# Patient Record
Sex: Male | Born: 1950 | Race: White | Hispanic: No | Marital: Married | Smoking: Never smoker
Health system: Southern US, Community
[De-identification: ages and names within clinical notes are randomized; demographics above are authoritative.]

## PROBLEM LIST (undated history)

## (undated) DIAGNOSIS — E119 Type 2 diabetes mellitus without complications: Secondary | ICD-10-CM

## (undated) DIAGNOSIS — N289 Disorder of kidney and ureter, unspecified: Secondary | ICD-10-CM

## (undated) DIAGNOSIS — S37009A Unspecified injury of unspecified kidney, initial encounter: Secondary | ICD-10-CM

## (undated) HISTORY — PX: FOOT SURGERY: SHX648

## (undated) SURGERY — Surgical Case
Anesthesia: *Unknown

---

## 2019-12-08 ENCOUNTER — Emergency Department (HOSPITAL_COMMUNITY): Payer: Medicare Other

## 2019-12-08 ENCOUNTER — Other Ambulatory Visit: Payer: Self-pay

## 2019-12-08 ENCOUNTER — Encounter (HOSPITAL_COMMUNITY): Payer: Self-pay | Admitting: *Deleted

## 2019-12-08 ENCOUNTER — Inpatient Hospital Stay (HOSPITAL_COMMUNITY)
Admission: EM | Admit: 2019-12-08 | Discharge: 2019-12-13 | DRG: 629 | Disposition: A | Discharge status: Home Health | Payer: Medicare Other | Attending: Family Medicine | Admitting: Family Medicine

## 2019-12-08 DIAGNOSIS — Z79899 Other long term (current) drug therapy: Secondary | ICD-10-CM | POA: Diagnosis not present

## 2019-12-08 DIAGNOSIS — E1165 Type 2 diabetes mellitus with hyperglycemia: Secondary | ICD-10-CM | POA: Diagnosis present

## 2019-12-08 DIAGNOSIS — E785 Hyperlipidemia, unspecified: Secondary | ICD-10-CM | POA: Diagnosis present

## 2019-12-08 DIAGNOSIS — L03115 Cellulitis of right lower limb: Secondary | ICD-10-CM | POA: Diagnosis present

## 2019-12-08 DIAGNOSIS — Z8249 Family history of ischemic heart disease and other diseases of the circulatory system: Secondary | ICD-10-CM | POA: Diagnosis not present

## 2019-12-08 DIAGNOSIS — M86172 Other acute osteomyelitis, left ankle and foot: Secondary | ICD-10-CM | POA: Diagnosis present

## 2019-12-08 DIAGNOSIS — I10 Essential (primary) hypertension: Secondary | ICD-10-CM | POA: Diagnosis present

## 2019-12-08 DIAGNOSIS — L97529 Non-pressure chronic ulcer of other part of left foot with unspecified severity: Secondary | ICD-10-CM | POA: Diagnosis present

## 2019-12-08 DIAGNOSIS — N184 Chronic kidney disease, stage 4 (severe): Secondary | ICD-10-CM | POA: Diagnosis present

## 2019-12-08 DIAGNOSIS — E1169 Type 2 diabetes mellitus with other specified complication: Principal | ICD-10-CM | POA: Diagnosis present

## 2019-12-08 DIAGNOSIS — M86672 Other chronic osteomyelitis, left ankle and foot: Secondary | ICD-10-CM | POA: Diagnosis present

## 2019-12-08 DIAGNOSIS — E11621 Type 2 diabetes mellitus with foot ulcer: Secondary | ICD-10-CM | POA: Diagnosis present

## 2019-12-08 DIAGNOSIS — D649 Anemia, unspecified: Secondary | ICD-10-CM | POA: Diagnosis present

## 2019-12-08 DIAGNOSIS — R600 Localized edema: Secondary | ICD-10-CM | POA: Diagnosis present

## 2019-12-08 DIAGNOSIS — M869 Osteomyelitis, unspecified: Secondary | ICD-10-CM

## 2019-12-08 DIAGNOSIS — E1122 Type 2 diabetes mellitus with diabetic chronic kidney disease: Secondary | ICD-10-CM | POA: Diagnosis present

## 2019-12-08 DIAGNOSIS — N179 Acute kidney failure, unspecified: Secondary | ICD-10-CM | POA: Diagnosis present

## 2019-12-08 DIAGNOSIS — L03116 Cellulitis of left lower limb: Secondary | ICD-10-CM | POA: Diagnosis present

## 2019-12-08 DIAGNOSIS — Z833 Family history of diabetes mellitus: Secondary | ICD-10-CM | POA: Diagnosis not present

## 2019-12-08 DIAGNOSIS — L97429 Non-pressure chronic ulcer of left heel and midfoot with unspecified severity: Secondary | ICD-10-CM | POA: Diagnosis present

## 2019-12-08 DIAGNOSIS — Z20822 Contact with and (suspected) exposure to covid-19: Secondary | ICD-10-CM | POA: Diagnosis present

## 2019-12-08 DIAGNOSIS — E1142 Type 2 diabetes mellitus with diabetic polyneuropathy: Secondary | ICD-10-CM | POA: Diagnosis present

## 2019-12-08 DIAGNOSIS — I129 Hypertensive chronic kidney disease with stage 1 through stage 4 chronic kidney disease, or unspecified chronic kidney disease: Secondary | ICD-10-CM | POA: Diagnosis present

## 2019-12-08 DIAGNOSIS — Z9884 Bariatric surgery status: Secondary | ICD-10-CM

## 2019-12-08 DIAGNOSIS — F329 Major depressive disorder, single episode, unspecified: Secondary | ICD-10-CM | POA: Diagnosis present

## 2019-12-08 DIAGNOSIS — Z6841 Body Mass Index (BMI) 40.0 and over, adult: Secondary | ICD-10-CM

## 2019-12-08 DIAGNOSIS — IMO0002 Reserved for concepts with insufficient information to code with codable children: Secondary | ICD-10-CM | POA: Diagnosis present

## 2019-12-08 HISTORY — DX: Type 2 diabetes mellitus without complications: E11.9

## 2019-12-08 HISTORY — DX: Unspecified injury of unspecified kidney, initial encounter: S37.009A

## 2019-12-08 HISTORY — DX: Disorder of kidney and ureter, unspecified: N28.9

## 2019-12-08 LAB — COMPREHENSIVE METABOLIC PANEL
ALT: 14 U/L (ref 0–44)
AST: 19 U/L (ref 15–41)
Albumin: 2.9 g/dL — ABNORMAL LOW (ref 3.5–5.0)
Alkaline Phosphatase: 102 U/L (ref 38–126)
Anion gap: 15 (ref 5–15)
BUN: 56 mg/dL — ABNORMAL HIGH (ref 8–23)
CO2: 20 mmol/L — ABNORMAL LOW (ref 22–32)
Calcium: 8.3 mg/dL — ABNORMAL LOW (ref 8.9–10.3)
Chloride: 103 mmol/L (ref 98–111)
Creatinine, Ser: 3.55 mg/dL — ABNORMAL HIGH (ref 0.61–1.24)
GFR calc Af Amer: 19 mL/min — ABNORMAL LOW (ref >60)
GFR calc non Af Amer: 16 mL/min — ABNORMAL LOW (ref >60)
Glucose, Bld: 196 mg/dL — ABNORMAL HIGH (ref 70–99)
Potassium: 3.8 mmol/L (ref 3.5–5.1)
Sodium: 138 mmol/L (ref 135–145)
Total Bilirubin: 0.6 mg/dL (ref 0.3–1.2)
Total Protein: 8.1 g/dL (ref 6.5–8.1)

## 2019-12-08 LAB — CBC WITH DIFFERENTIAL/PLATELET
Abs Immature Granulocytes: 0.12 10*3/uL — ABNORMAL HIGH (ref 0.00–0.07)
Basophils Absolute: 0 10*3/uL (ref 0.0–0.1)
Basophils Relative: 0 %
Eosinophils Absolute: 0 10*3/uL (ref 0.0–0.5)
Eosinophils Relative: 0 %
HCT: 33.5 % — ABNORMAL LOW (ref 39.0–52.0)
Hemoglobin: 10 g/dL — ABNORMAL LOW (ref 13.0–17.0)
Immature Granulocytes: 1 %
Lymphocytes Relative: 2 %
Lymphs Abs: 0.4 10*3/uL — ABNORMAL LOW (ref 0.7–4.0)
MCH: 28.2 pg (ref 26.0–34.0)
MCHC: 29.9 g/dL — ABNORMAL LOW (ref 30.0–36.0)
MCV: 94.6 fL (ref 80.0–100.0)
Monocytes Absolute: 0.9 10*3/uL (ref 0.1–1.0)
Monocytes Relative: 5 %
Neutro Abs: 16.4 10*3/uL — ABNORMAL HIGH (ref 1.7–7.7)
Neutrophils Relative %: 92 %
Platelets: 467 10*3/uL — ABNORMAL HIGH (ref 150–400)
RBC: 3.54 MIL/uL — ABNORMAL LOW (ref 4.22–5.81)
RDW: 15.9 % — ABNORMAL HIGH (ref 11.5–15.5)
WBC: 17.9 10*3/uL — ABNORMAL HIGH (ref 4.0–10.5)
nRBC: 0 % (ref 0.0–0.2)

## 2019-12-08 LAB — GLUCOSE, CAPILLARY: Glucose-Capillary: 161 mg/dL — ABNORMAL HIGH (ref 70–99)

## 2019-12-08 LAB — SARS CORONAVIRUS 2 BY RT PCR (HOSPITAL ORDER, PERFORMED IN ~~LOC~~ HOSPITAL LAB): SARS Coronavirus 2: NEGATIVE

## 2019-12-08 MED ORDER — HYDROCODONE-ACETAMINOPHEN 5-325 MG PO TABS: 1.0000 | ORAL_TABLET | ORAL | Status: DC | PRN

## 2019-12-08 MED ORDER — INSULIN ASPART 100 UNIT/ML ~~LOC~~ SOLN: 0.0000 [IU] | Freq: Every day | SUBCUTANEOUS | Status: DC

## 2019-12-08 MED ORDER — ONDANSETRON HCL 4 MG/2ML IJ SOLN
4.0000 mg | Freq: Four times a day (QID) | INTRAMUSCULAR | Status: DC | PRN
  Administered 2019-12-11: 4 mg via INTRAVENOUS
  Filled 2019-12-08: qty 2

## 2019-12-08 MED ORDER — ACETAMINOPHEN 325 MG PO TABS: 650.0000 mg | ORAL_TABLET | Freq: Four times a day (QID) | ORAL | Status: DC | PRN

## 2019-12-08 MED ORDER — MORPHINE SULFATE (PF) 2 MG/ML IV SOLN
2.0000 mg | INTRAVENOUS | Status: DC | PRN
  Administered 2019-12-08 – 2019-12-12 (×4): 2 mg via INTRAVENOUS
  Filled 2019-12-08 (×4): qty 1

## 2019-12-08 MED ORDER — SODIUM CHLORIDE 0.9 % IV SOLN: INTRAVENOUS | Status: DC

## 2019-12-08 MED ORDER — VANCOMYCIN HCL IN DEXTROSE 1-5 GM/200ML-% IV SOLN
1000.0000 mg | Freq: Once | INTRAVENOUS | Status: AC
  Administered 2019-12-08: 1000 mg via INTRAVENOUS
  Filled 2019-12-08: qty 200

## 2019-12-08 MED ORDER — INSULIN ASPART 100 UNIT/ML ~~LOC~~ SOLN
0.0000 [IU] | Freq: Three times a day (TID) | SUBCUTANEOUS | Status: DC
  Administered 2019-12-09 – 2019-12-10 (×2): 2 [IU] via SUBCUTANEOUS

## 2019-12-08 MED ORDER — ONDANSETRON HCL 4 MG PO TABS: 4.0000 mg | ORAL_TABLET | Freq: Four times a day (QID) | ORAL | Status: DC | PRN

## 2019-12-08 MED ORDER — SODIUM CHLORIDE 0.9 % IV SOLN
2.0000 g | Freq: Once | INTRAVENOUS | Status: AC
  Administered 2019-12-08: 2 g via INTRAVENOUS
  Filled 2019-12-08: qty 2

## 2019-12-08 MED ORDER — ACETAMINOPHEN 650 MG RE SUPP: 650.0000 mg | Freq: Four times a day (QID) | RECTAL | Status: DC | PRN

## 2019-12-08 MED ORDER — METRONIDAZOLE IN NACL 5-0.79 MG/ML-% IV SOLN
500.0000 mg | Freq: Once | INTRAVENOUS | Status: AC
  Administered 2019-12-08: 500 mg via INTRAVENOUS
  Filled 2019-12-08: qty 100

## 2019-12-08 NOTE — ED Provider Notes (Signed)
[place] Provider Note   CSN: [id number] Arrival date & time: 12/08/19  1630     History Chief Complaint  Patient presents with  . Foot Pain    Connor Taylor is a 69 y.o. male.  The history is provided by the patient. No language interpreter was used.  Foot Pain The current episode started more than 1 week ago. The problem occurs constantly. The problem has been gradually worsening. Nothing aggravates the symptoms. Nothing relieves the symptoms.   Pt was sent here by Dr. Caprice Beaver for admission.  Pt has an infection to his left foot.  Infection is getting worse    Past Medical History:  Diagnosis Date  . Diabetes mellitus without complication ([place])   . Kidney damage     There are no problems to display for this patient.   Past Surgical History:  Procedure Laterality Date  . FOOT SURGERY         No family history on file.  Social History   Tobacco Use  . Smoking status: Never Smoker  . Smokeless tobacco: Never Used  Substance Use Topics  . Alcohol use: Never  . Drug use: Never    Home Medications Prior to Admission medications   Not on File    Allergies    Patient has no known allergies.  Review of Systems   Review of Systems  All other systems reviewed and are negative.   Physical Exam Updated Vital Signs BP 140/61 (BP Location: Right Arm)   Pulse 98   Temp 98.2 F (36.8 C) (Oral)   Resp 18   Ht 5\' 6"  (1.676 m)   Wt 117.9 kg   SpO2 100%   BMI 41.97 kg/m   Physical Exam Vitals reviewed.  Cardiovascular:     Rate and Rhythm: Normal rate.  Pulmonary:     Effort: Pulmonary effort is normal.  Musculoskeletal:        General: Swelling and tenderness present.     Comments: Ulcer left heel, oozing   Neurological:     General: No focal deficit present.  Psychiatric:        Mood and Affect: Mood normal.     ED Results / Procedures / Treatments   Labs (all labs ordered are listed, but only abnormal results are  displayed) Labs Reviewed  CBC WITH DIFFERENTIAL/PLATELET - Abnormal; Notable for the following components:      Result Value   WBC 17.9 (*)    RBC 3.54 (*)    Hemoglobin 10.0 (*)    HCT 33.5 (*)    MCHC 29.9 (*)    RDW 15.9 (*)    Platelets 467 (*)    Neutro Abs 16.4 (*)    Lymphs Abs 0.4 (*)    Abs Immature Granulocytes 0.12 (*)    All other components within normal limits  COMPREHENSIVE METABOLIC PANEL - Abnormal; Notable for the following components:   CO2 20 (*)    Glucose, Bld 196 (*)    BUN 56 (*)    Creatinine, Ser 3.55 (*)    Calcium 8.3 (*)    Albumin 2.9 (*)    GFR calc non Af Amer 16 (*)    GFR calc Af Amer 19 (*)    All other components within normal limits  CULTURE, BLOOD (ROUTINE X 2)  CULTURE, BLOOD (ROUTINE X 2)    EKG None  Radiology MR FOOT LEFT WO CONTRAST  Result Date: 12/08/2019 CLINICAL DATA:  Diabetic foot swelling,  possible osteomyelitis EXAM: MRI OF THE LEFT FOOT WITHOUT CONTRAST TECHNIQUE: Multiplanar, multisequence MR imaging of the ankle was performed. No intravenous contrast was administered. COMPARISON:  None. FINDINGS: TENDONS Peroneal: Mild common peroneus tenosynovitis. Posteromedial: Distal tibialis posterior tenosynovitis and mild distal tibialis posterior tendinopathy. Mild flexor hallucis longus tenosynovitis just proximal to the knot of Henry. Anterior: Unremarkable Achilles: Distal Achilles tendinopathy with expansion of the distal Achilles tendon and multiple spurs and ossicles in the calcaneus along the distal Achilles attachment site. Plantar Fascia: There is discontinuity in both the medial and lateral bands of the plantar fascia proximally due to a large fluid collection which extends into the calcaneus and drains down to the plantar surface of the foot. Appearance is compatible with partial rupture. LIGAMENTS Lateral: Attenuated distal attachment of the anterior talofibular ligament, query remote injury. Thickened calcaneofibular  ligament. Medial: Thickened tibiospring and tibionavicular components of the deltoid ligament. CARTILAGE Ankle Joint: 0.8 by 1.1 by 0.6 cm non-fragmented osteochondral lesion of the lateral talar dome, image 18/5. Mild degenerative chondral thinning. Subtalar Joints/Sinus Tarsi: Small effusion of the posterior subtalar facet. Spurring of the subtalar joint. Bones: Abnormal fluid collection in the calcaneus measuring 3.5 by 2.4 by 2.2 cm, likely chronic, with surrounding mild marrow edema in the calcaneus. This fluid collection is continuous with a soft tissue fluid collection extending in region of the mostly ruptured plantar fascia. There is a draining sinus tract to the plantar heel on image 10/6. Degenerative and possibly erosive findings in the midfoot and Lisfranc joint but no other regions compelling for active osteomyelitis. Other: Diffuse striking subcutaneous edema in the ankle, possibly from cellulitis. Diffuse muscular edema, especially along the plantar hindfoot and midfoot, which could be neurogenic edema or myositis. IMPRESSION: 1. 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 2. Diffuse striking subcutaneous edema in the ankle, possibly from cellulitis. 3. Diffuse muscular edema, especially along the plantar hindfoot and midfoot, which could be neurogenic edema or myositis. 4. Distal Achilles tendinopathy with expansion of the distal Achilles tendon and multiple spurs and ossicles along the distal Achilles attachment site. 5. Distal tibialis posterior tenosynovitis and mild distal tibialis posterior tendinopathy. 6. Mild flexor hallucis longus tenosynovitis just proximal to the knot of Henry. 7. Mild common peroneus tenosynovitis. 8. Attenuated distal attachment of the anterior talofibular ligament, query  remote injury. 9. Thickened tibiospring and tibionavicular components of the deltoid ligament, query remote injury. 10. 0.8 by 1.1 cm non-fragmented osteochondral lesion of the lateral talar dome. 11. Degenerative and possibly erosive findings in the midfoot and Lisfranc joint but no other regions compelling for active osteomyelitis. Electronically Signed   By: Van Clines M.D.   On: 12/08/2019 18:30    Procedures Procedures (including critical care time)  Medications Ordered in ED Medications - No data to display  ED Course  I have reviewed the triage vital signs and the nursing notes.  Pertinent labs & imaging results that were available during my care of the patient were reviewed by me and considered in my medical decision making (see chart for details).    MDM Rules/Calculators/A&P                      MDM:  Elevated wbc count  Elevated renal functions.  Mri foot shows osteomyelitis of heel Consult to hospitalist  to admit.  I spoke to Dr. Humphrey Rolls who will see here  Final Clinical Impression(s) / ED Diagnoses Final diagnoses:  Osteomyelitis of left foot, unspecified type [facility])    Rx / [place] Orders ED Discharge Orders    None       Sidney Ace 12/08/19 1917    Milton Ferguson, MD 12/09/19 [phone]

## 2019-12-08 NOTE — ED Triage Notes (Signed)
Left foot pain, referred by podiatry

## 2019-12-09 DIAGNOSIS — N179 Acute kidney failure, unspecified: Secondary | ICD-10-CM | POA: Diagnosis present

## 2019-12-09 DIAGNOSIS — I10 Essential (primary) hypertension: Secondary | ICD-10-CM | POA: Diagnosis present

## 2019-12-09 DIAGNOSIS — E1165 Type 2 diabetes mellitus with hyperglycemia: Secondary | ICD-10-CM | POA: Insufficient documentation

## 2019-12-09 DIAGNOSIS — M86172 Other acute osteomyelitis, left ankle and foot: Secondary | ICD-10-CM

## 2019-12-09 LAB — COMPREHENSIVE METABOLIC PANEL
ALT: 12 U/L (ref 0–44)
AST: 20 U/L (ref 15–41)
Albumin: 2.2 g/dL — ABNORMAL LOW (ref 3.5–5.0)
Alkaline Phosphatase: 77 U/L (ref 38–126)
Anion gap: 12 (ref 5–15)
BUN: 55 mg/dL — ABNORMAL HIGH (ref 8–23)
CO2: 22 mmol/L (ref 22–32)
Calcium: 7.9 mg/dL — ABNORMAL LOW (ref 8.9–10.3)
Chloride: 106 mmol/L (ref 98–111)
Creatinine, Ser: 3.5 mg/dL — ABNORMAL HIGH (ref 0.61–1.24)
GFR calc Af Amer: 19 mL/min — ABNORMAL LOW (ref >60)
GFR calc non Af Amer: 17 mL/min — ABNORMAL LOW (ref >60)
Glucose, Bld: 105 mg/dL — ABNORMAL HIGH (ref 70–99)
Potassium: 3.4 mmol/L — ABNORMAL LOW (ref 3.5–5.1)
Sodium: 140 mmol/L (ref 135–145)
Total Bilirubin: 0.8 mg/dL (ref 0.3–1.2)
Total Protein: 6.2 g/dL — ABNORMAL LOW (ref 6.5–8.1)

## 2019-12-09 LAB — GLUCOSE, CAPILLARY
Glucose-Capillary: 96 mg/dL (ref 70–99)
Glucose-Capillary: 89 mg/dL (ref 70–99)
Glucose-Capillary: 137 mg/dL — ABNORMAL HIGH (ref 70–99)
Glucose-Capillary: 141 mg/dL — ABNORMAL HIGH (ref 70–99)

## 2019-12-09 LAB — CBC
HCT: 28.1 % — ABNORMAL LOW (ref 39.0–52.0)
Hemoglobin: 8.4 g/dL — ABNORMAL LOW (ref 13.0–17.0)
MCH: 27.6 pg (ref 26.0–34.0)
MCHC: 29.9 g/dL — ABNORMAL LOW (ref 30.0–36.0)
MCV: 92.4 fL (ref 80.0–100.0)
Platelets: 402 10*3/uL — ABNORMAL HIGH (ref 150–400)
RBC: 3.04 MIL/uL — ABNORMAL LOW (ref 4.22–5.81)
RDW: 15.6 % — ABNORMAL HIGH (ref 11.5–15.5)
WBC: 13 10*3/uL — ABNORMAL HIGH (ref 4.0–10.5)
nRBC: 0 % (ref 0.0–0.2)

## 2019-12-09 LAB — HEMOGLOBIN A1C
Hgb A1c MFr Bld: 7.2 % — ABNORMAL HIGH (ref 4.8–5.6)
Mean Plasma Glucose: 159.94 mg/dL

## 2019-12-09 LAB — SEDIMENTATION RATE: Sed Rate: 120 mm/hr — ABNORMAL HIGH (ref 0–16)

## 2019-12-09 LAB — HIV ANTIBODY (ROUTINE TESTING W REFLEX): HIV Screen 4th Generation wRfx: NONREACTIVE

## 2019-12-09 LAB — C-REACTIVE PROTEIN: CRP: 24.9 mg/dL — ABNORMAL HIGH (ref <1.0)

## 2019-12-09 MED ORDER — SODIUM CHLORIDE 0.9 % IV SOLN
2.0000 g | INTRAVENOUS | Status: DC
  Administered 2019-12-09 – 2019-12-12 (×4): 2 g via INTRAVENOUS
  Filled 2019-12-09 (×4): qty 2

## 2019-12-09 MED ORDER — TORSEMIDE 20 MG PO TABS
40.0000 mg | ORAL_TABLET | Freq: Two times a day (BID) | ORAL | Status: DC
  Administered 2019-12-09 – 2019-12-13 (×9): 40 mg via ORAL
  Filled 2019-12-09 (×9): qty 2

## 2019-12-09 MED ORDER — METRONIDAZOLE IN NACL 5-0.79 MG/ML-% IV SOLN
500.0000 mg | Freq: Three times a day (TID) | INTRAVENOUS | Status: DC
  Administered 2019-12-09 – 2019-12-13 (×14): 500 mg via INTRAVENOUS
  Filled 2019-12-09 (×14): qty 100

## 2019-12-09 MED ORDER — POTASSIUM CHLORIDE CRYS ER 20 MEQ PO TBCR
40.0000 meq | EXTENDED_RELEASE_TABLET | ORAL | Status: AC
  Administered 2019-12-09 (×2): 40 meq via ORAL
  Filled 2019-12-09 (×2): qty 2

## 2019-12-09 MED ORDER — DULOXETINE HCL 60 MG PO CPEP
60.0000 mg | ORAL_CAPSULE | Freq: Every day | ORAL | Status: DC
  Administered 2019-12-09 – 2019-12-13 (×5): 60 mg via ORAL
  Filled 2019-12-09 (×5): qty 1

## 2019-12-09 MED ORDER — AMLODIPINE BESYLATE 5 MG PO TABS
2.5000 mg | ORAL_TABLET | Freq: Every day | ORAL | Status: DC
  Administered 2019-12-09: 2.5 mg via ORAL
  Filled 2019-12-09: qty 1

## 2019-12-09 MED ORDER — PANTOPRAZOLE SODIUM 40 MG PO TBEC
40.0000 mg | DELAYED_RELEASE_TABLET | Freq: Two times a day (BID) | ORAL | Status: DC
  Administered 2019-12-09 – 2019-12-13 (×9): 40 mg via ORAL
  Filled 2019-12-09 (×9): qty 1

## 2019-12-09 MED ORDER — VANCOMYCIN HCL 1500 MG/300ML IV SOLN
1500.0000 mg | INTRAVENOUS | Status: DC
  Administered 2019-12-11 – 2019-12-12 (×2): 1500 mg via INTRAVENOUS
  Filled 2019-12-09 (×2): qty 300

## 2019-12-09 MED ORDER — VANCOMYCIN HCL 500 MG/100ML IV SOLN
500.0000 mg | Freq: Once | INTRAVENOUS | Status: AC
  Administered 2019-12-09: 500 mg via INTRAVENOUS
  Filled 2019-12-09 (×2): qty 100

## 2019-12-09 MED ORDER — AMLODIPINE BESYLATE 5 MG PO TABS
5.0000 mg | ORAL_TABLET | Freq: Every morning | ORAL | Status: DC
  Administered 2019-12-09 – 2019-12-13 (×5): 5 mg via ORAL
  Filled 2019-12-09 (×5): qty 1

## 2019-12-09 MED ORDER — HYDROCODONE-ACETAMINOPHEN 7.5-325 MG/15ML PO SOLN
10.0000 mL | ORAL | Status: DC | PRN
  Administered 2019-12-09 – 2019-12-12 (×2): 10 mL via ORAL
  Filled 2019-12-09 (×2): qty 15

## 2019-12-09 MED ORDER — ATORVASTATIN CALCIUM 20 MG PO TABS
20.0000 mg | ORAL_TABLET | Freq: Every day | ORAL | Status: DC
  Administered 2019-12-09 – 2019-12-13 (×5): 20 mg via ORAL
  Filled 2019-12-09 (×5): qty 1

## 2019-12-09 NOTE — Progress Notes (Signed)
Patient seen and evaluated, chart reviewed, please see EMR for updated orders. Please see full H&P dictated by admitting physician Dr. Humphrey Rolls for same date of service.   Brief Summary:- 69 y.o. male with medical history significant of hypertension, hyperlipidemia, diabetes mellitus and chronic kidney disease patient of Dr. Kalman Shan admitted on 12/09/2019 with left foot cellulitis and osteomyelitis   A/p 1) cellulitis and osteomyelitis of the left foot--MRI consistent with cellulitis and osteomyelitis -Flagyl 500 q8 Cefepime 2 g q24h Vanc 500 mg x 1 now (to add to previous 1 g dose) then 1500 q48 starting 6/7 0000 Monitor renal fx cx vanc lvls prn -CRP 25 and ESR is 120 - to OR on 12/11/2019 for I&D and deep wound cultures -Discussed with podiatrist Dr. 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 Please see full H&P dictated by admitting physician Dr. Humphrey Rolls for same date of service

## 2019-12-09 NOTE — Consult Note (Signed)
Podiatry Consult Note  Reason for Consultation:  Osteomyelitis/abscess of the left heel.  History of Present Illness: Connor Taylor is a 69 y.o. 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  He has been followed by general surgery, infectious disease and the wound care center in [place].  The ulceration was healed on 11/06/2019.  He went to the ED at [facility] in [place] on December 05, 2019 complaining of increased pain and swelling in his left lower extremity.  Labs were performed.  His white count was elevated 11.79.  He has creatinine was 3.23.  A venous Doppler was ordered it was negative for DVT.  No new radiographs were performed.  No antibiotics were administered.  He was discharged home with instructions to follow-up with his primary care physician.  His symptoms worsened and he contacted our office on December 07, 2019.  He was initially evaluated by Dr. Posey Pronto.  He was placed on Cleocin and Flagyl.  He returned to the office on December 08, 2019.  He complained of increased pain, swelling and lethargy.  He was referred to [facility] for admission.  He has not been taking any antihyperglycemic medication recently because "his blood sugar was dropping since he was able to get some fluid off."  Per my office records he has previously taken glimepiride and Lantus.  Past Medical History:  Diagnosis Date  . Diabetes mellitus without complication ([place])   . Kidney damage    Scheduled Meds: . amLODipine  2.5 mg Oral QHS  . amLODipine  5 mg Oral q morning - 10a  . atorvastatin  20 mg Oral Daily  . DULoxetine  60 mg Oral Daily  . insulin aspart  0-15 Units Subcutaneous TID WC  . insulin aspart  0-5 Units Subcutaneous QHS  . pantoprazole  40 mg Oral BID  . torsemide  40 mg Oral BID   Continuous Infusions: . sodium chloride 75 mL/hr at 12/08/19 2203  . ceFEPime (MAXIPIME) IV    . metronidazole 500 mg (12/09/19 0444)  . [START ON 12/11/2019]  vancomycin     PRN Meds:.acetaminophen **OR** acetaminophen, HYDROcodone-acetaminophen, morphine injection, ondansetron **OR** ondansetron (ZOFRAN) IV  No Known Allergies Past Surgical History:  Procedure Laterality Date  . FOOT SURGERY     No family history on file. Social History:  reports that he has never smoked. He has never used smokeless tobacco. He reports that he does not drink alcohol or use drugs.  Review of Systems: Significant for fatigue, lower extremity edema, chronic ulceration of the left heel and left foot pain.  He denies any nausea, vomiting, chills or shortness of breath.  Physical Examination: Vital signs in last 24 hours:   Temp:  [98.2 F (36.8 C)-99.3 F (37.4 C)] 98.7 F (37.1 C) (06/05 0553) Pulse Rate:  [87-98] 98 (06/05 0553) Resp:  [18] 18 (06/05 0553) BP: (127-150)/(61-76) 127/64 (06/05 0553) SpO2:  [95 %-100 %] 95 % (06/05 0553) Weight:  [601 kg-117.9 kg] 112 kg (06/04 2148)  DRESSING: Clean, dry and intact without strikethrough. INTEGUMENT: The skin of both feet is warm.  There is a focal ulceration along the central plantar aspect of the left heel.  The wound bed is fibrotic.  There is periwound erythema with an area of fluctuance posterior medial aspect of the ulceration extending to the lateral aspect of the left heel.  The area is exquisitely tender to palpation. VASCULAR: The left dorsalis pedis pulse  is palpable.  I was unable to palpate the left posterior tibial pulse due to presence of edema.  The right dorsalis pedis pulse is palpable.  I was unable to palpate the right posterior tibial pulse due to presence of edema.  There is significant edema of both lower extremities (left greater than right). NEUROLOGIC: Protective sensation is diminished bilaterally.  Lab/Test Results:   Recent Labs    12/08/19 1754 12/09/19 0614  WBC 17.9* 13.0*  HGB 10.0* 8.4*  HCT 33.5* 28.1*  PLT 467* 402*  NA 138 140  K 3.8 3.4*  CL 103 106  CO2 20* 22   BUN 56* 55*  CREATININE 3.55* 3.50*  GLUCOSE 196* 105*  CALCIUM 8.3* 7.9*    Recent Results (from the past 240 hour(s))  Culture, blood (Routine X 2) w Reflex to ID Panel     Status: None (Preliminary result)   Collection Time: 12/08/19  5:55 PM   Specimen: Left Antecubital; Blood  Result Value Ref Range Status   Specimen Description LEFT ANTECUBITAL  Final   Special Requests   Final    BOTTLES DRAWN AEROBIC AND ANAEROBIC Blood Culture adequate volume   Culture   Final    NO GROWTH < 12 HOURS Performed at [facility], [address] [place], [place] [address]    Report Status PENDING  Incomplete  Culture, blood (Routine X 2) w Reflex to ID Panel     Status: None (Preliminary result)   Collection Time: 12/08/19  5:55 PM   Specimen: Right Antecubital; Blood  Result Value Ref Range Status   Specimen Description RIGHT ANTECUBITAL  Final   Special Requests   Final    BOTTLES DRAWN AEROBIC AND ANAEROBIC Blood Culture adequate volume   Culture   Final    NO GROWTH < 12 HOURS Performed at [facility], [address], [place], [place] [address]    Report Status PENDING  Incomplete  SARS Coronavirus 2 by RT PCR (hospital order, performed in [place] hospital lab) Nasopharyngeal Nasopharyngeal Swab     Status: None   Collection Time: 12/08/19  7:09 PM   Specimen: Nasopharyngeal Swab  Result Value Ref Range Status   SARS Coronavirus 2 NEGATIVE NEGATIVE Final    Comment: (NOTE) SARS-CoV-2 target nucleic acids are NOT DETECTED. The SARS-CoV-2 RNA is generally detectable in upper and lower respiratory specimens during the acute phase of infection. The lowest concentration of SARS-CoV-2 viral copies this assay can detect is 250 copies / mL. A negative result does not preclude SARS-CoV-2 infection and should not be used as the sole basis for treatment or other patient management decisions.  A negative result may occur with improper specimen collection / handling, submission of  specimen other than nasopharyngeal swab, presence of viral mutation(s) within the areas targeted by this assay, and inadequate number of viral copies (<250 copies / mL). A negative result must be combined with clinical observations, patient history, and epidemiological information. Fact Sheet for Patients:   StrictlyIdeas.no Fact Sheet for Healthcare Providers: BankingDealers.co.za This test is not yet approved or cleared  by the [place] FDA and has been authorized for detection and/or diagnosis of SARS-CoV-2 by FDA under an Emergency Use Authorization (EUA).  This EUA will remain in effect (meaning this test can be used) for the duration of the COVID-19 declaration under Section 564(b)(1) of the Act, 21 U.S.C. section 360bbb-3(b)(1), unless the authorization is terminated or revoked sooner. Performed at [facility], [address], [place],  [place] [address]      MR FOOT LEFT WO CONTRAST  Result Date: 12/08/2019 CLINICAL DATA:  Diabetic foot swelling, possible osteomyelitis EXAM: MRI OF THE LEFT FOOT WITHOUT CONTRAST TECHNIQUE: Multiplanar, multisequence MR imaging of the ankle was performed. No intravenous contrast was administered. COMPARISON:  None. FINDINGS: TENDONS Peroneal: Mild common peroneus tenosynovitis. Posteromedial: Distal tibialis posterior tenosynovitis and mild distal tibialis posterior tendinopathy. Mild flexor hallucis longus tenosynovitis just proximal to the knot of Henry. Anterior: Unremarkable Achilles: Distal Achilles tendinopathy with expansion of the distal Achilles tendon and multiple spurs and ossicles in the calcaneus along the distal Achilles attachment site. Plantar Fascia: There is discontinuity in both the medial and lateral bands of the plantar fascia proximally due to a large fluid collection which extends into the calcaneus and drains down to the plantar surface of the foot. Appearance is compatible with  partial rupture. LIGAMENTS Lateral: Attenuated distal attachment of the anterior talofibular ligament, query remote injury. Thickened calcaneofibular ligament. Medial: Thickened tibiospring and tibionavicular components of the deltoid ligament. CARTILAGE Ankle Joint: 0.8 by 1.1 by 0.6 cm non-fragmented osteochondral lesion of the lateral talar dome, image 18/5. Mild degenerative chondral thinning. Subtalar Joints/Sinus Tarsi: Small effusion of the posterior subtalar facet. Spurring of the subtalar joint. Bones: Abnormal fluid collection in the calcaneus measuring 3.5 by 2.4 by 2.2 cm, likely chronic, with surrounding mild marrow edema in the calcaneus. This fluid collection is continuous with a soft tissue fluid collection extending in region of the mostly ruptured plantar fascia. There is a draining sinus tract to the plantar heel on image 10/6. Degenerative and possibly erosive findings in the midfoot and Lisfranc joint but no other regions compelling for active osteomyelitis. Other: Diffuse striking subcutaneous edema in the ankle, possibly from cellulitis. Diffuse muscular edema, especially along the plantar hindfoot and midfoot, which could be neurogenic edema or myositis. IMPRESSION: 1. 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 2. Diffuse striking subcutaneous edema in the ankle, possibly from cellulitis. 3. Diffuse muscular edema, especially along the plantar hindfoot and midfoot, which could be neurogenic edema or myositis. 4. Distal Achilles tendinopathy with expansion of the distal Achilles tendon and multiple spurs and ossicles along the distal Achilles attachment site. 5. Distal tibialis posterior tenosynovitis and mild distal tibialis posterior tendinopathy. 6. Mild flexor hallucis longus tenosynovitis just  proximal to the knot of Henry. 7. Mild common peroneus tenosynovitis. 8. Attenuated distal attachment of the anterior talofibular ligament, query remote injury. 9. Thickened tibiospring and tibionavicular components of the deltoid ligament, query remote injury. 10. 0.8 by 1.1 cm non-fragmented osteochondral lesion of the lateral talar dome. 11. Degenerative and possibly erosive findings in the midfoot and Lisfranc joint but no other regions compelling for active osteomyelitis. Electronically Signed   By: Van Clines M.D.   On: 12/08/2019 18:30   Assessment: 1.  Abscess, left heel. 2.  Chronic osteomyelitis, left calcaneus. 3.  Diabetes mellitus with peripheral neuropathy. 4.  Chronic kidney disease.  Plan: I applied a dry dressing to his left foot.  I explained the MRI findings to the patient.  I recommended proceeding with incision and drainage of the left foot with possible application of wound VAC.  Deep cultures will be obtained.  He understands and agrees with this course of care.  The plan was discussed with Dr. Denton Brick  who was at bedside.  For now, we will continue IV antibiotics per hospitalist service.  I plan to proceed with surgery on Monday.     Brita Romp 12/09/2019, 10:57 AM

## 2019-12-09 NOTE — H&P (Signed)
History and Physical    Connor Taylor ZOX:[id number] DOB: December 19, 1950 DOA: 12/08/2019  PCP: System, Provider Not In (Confirm with patient/family/NH records and if not entered, this has to be entered at [facility] point of entry) Patient coming from: Home  I have personally briefly reviewed patient's old medical records in [place]  Chief Complaint: Patient sent to ED by podiatrist/Dr. Caprice Beaver  HPI: Connor Taylor is a 69 y.o. male with medical history significant of hypertension, hyperlipidemia, diabetes mellitus and chronic kidney disease who was sent here by Dr. Kalman Shan for admission because of left foot infection.  Patient states that he has wound on his left heel for the last few weeks and it continue to worsen and now he is having bad infection of left foot with continuous pain.  Patient states that he used outpatient antibiotics and pain medications without any relief.  Patient visited podiatrist/Dr. Caprice Beaver who advised him to go to ED for admission.  Patient otherwise denies fever, chills, chest pain, shortness of breath, nausea, vomiting, abdominal pain and urinary symptoms.  (For level 3, the HPI must include 4+ descriptors: Location, Quality, Severity, Duration, Timing, Context, modifying factors, associated signs/symptoms and/or status of 3+ chronic problems.)  (Please avoid self-populating past medical history here) (The initial 2-3 lines should be focused and good to copy and paste in the HPI section of the daily progress note).  ED Course: On arrival to the ED patient had blood pressure of 140/61, temperature 98.2, heart rate 98, respiratory rate 18 and oxygen saturation 100% on room air.  Blood work showed leukocytosis with WBC count of 17.9, hemoglobin 10.0, sodium 138, potassium 3.8, BUN 56, creatinine 3.5, blood glucose 196 and albumin 2.9.  MRI of left foot is positive for osteomyelitis of heel.  Patient is given IV vancomycin, IV cefepime and IV metronidazole in the  ED.  Review of Systems: As per HPI otherwise 10 point review of systems negative.  Unacceptable ROS statements: 10 systems reviewed, Extensive (without elaboration).  Acceptable ROS statements: All others negative, All others reviewed and are negative, and All others unremarkable, with at [place] documented Cant double dip - if using for HPI cant use for ROS  Past Medical History:  Diagnosis Date   Diabetes mellitus without complication ([place])    Kidney damage     Past Surgical History:  Procedure Laterality Date   FOOT SURGERY       reports that he has never smoked. He has never used smokeless tobacco. He reports that he does not drink alcohol or use drugs.  No Known Allergies  No family history on file. Family history is positive for hypertension and diabetes mellitus Unacceptable: Noncontributory, unremarkable, or negative. Acceptable: (example)Family history negative for heart disease  Prior to Admission medications   Medication Sig Start Date End Date Taking? Authorizing Provider  amLODipine (NORVASC) 2.5 MG tablet Take 2.5 mg by mouth at bedtime.  09/21/19  Yes [provider]  amLODipine (NORVASC) 5 MG tablet Take 5 mg by mouth every morning.  11/10/19  Yes [provider]  atorvastatin (LIPITOR) 20 MG tablet Take 20 mg by mouth daily. 09/21/19  Yes [provider]  Biotin 5 MG CAPS Take 1 capsule by mouth daily.   Yes [provider]  Calcium Carbonate-Vitamin D 600-400 MG-UNIT tablet Take 1 tablet by mouth daily.   Yes [provider]  clindamycin (CLEOCIN) 300 MG capsule Take 300 mg by mouth 3 (three) times daily. 12/07/19  Yes [provider]  doxycycline (ADOXA) 100 MG tablet Take 100 mg by mouth 2 (two) times daily. 11/16/19  Yes [provider]  DULoxetine (CYMBALTA) 60 MG capsule Take 60 mg by mouth daily. 11/10/19  Yes [provider]  ferrous sulfate 325 (65 FE) MG EC tablet Take 1  tablet by mouth 2 (two) times daily.   Yes [provider]  metroNIDAZOLE (FLAGYL) 500 MG tablet Take 500 mg by mouth 2 (two) times daily. 12/07/19  Yes [provider]  pantoprazole (PROTONIX) 40 MG tablet Take 40 mg by mouth 2 (two) times daily. 10/25/19  Yes [provider]  torsemide (DEMADEX) 20 MG tablet Take 40 mg by mouth 2 (two) times daily. 11/12/19  Yes [provider]  zinc sulfate 220 (50 Zn) MG capsule Take 1 capsule by mouth daily.   Yes [provider]    Physical Exam: Vitals:   12/08/19 1817 12/08/19 2128 12/08/19 2148 12/09/19 0210  BP:  (!) 142/75 (!) 150/76 130/72  Pulse:  87 96 90  Resp:  18 18 18   Temp:   98.9 F (37.2 C) 99.3 F (37.4 C)  TempSrc:   Oral Oral  SpO2:  100% 97% 95%  Weight: 117.9 kg  112 kg   Height: 5\' 6"  (1.676 m)  5\' 6"  (1.676 m)     Constitutional: NAD, calm, comfortable Vitals:   12/08/19 1817 12/08/19 2128 12/08/19 2148 12/09/19 0210  BP:  (!) 142/75 (!) 150/76 130/72  Pulse:  87 96 90  Resp:  18 18 18   Temp:   98.9 F (37.2 C) 99.3 F (37.4 C)  TempSrc:   Oral Oral  SpO2:  100% 97% 95%  Weight: 117.9 kg  112 kg   Height: 5\' 6"  (1.676 m)  5\' 6"  (1.676 m)     General: Patient is a 69 year old obese male in no acute distress Eyes: PERRL, lids and conjunctivae normal ENMT: Mucous membranes are moist. Posterior pharynx clear of any exudate or lesions.Normal dentition.  Neck: normal, supple, no masses, no thyromegaly Respiratory: clear to auscultation bilaterally, no wheezing, no crackles. Normal respiratory effort. No accessory muscle use.  Cardiovascular: Regular rate and rhythm, no murmurs / rubs / gallops.  2+ nonpitting edema in bilateral lower extremities. 2+ pedal pulses. No carotid bruits.  Abdomen: no tenderness, no masses palpated. No hepatosplenomegaly. Bowel sounds positive.  Musculoskeletal: no clubbing / cyanosis. No joint deformity upper and lower extremities. Good ROM, no  contractures. Normal muscle tone.  Skin: Chronic cellulitis on bilateral lower extremities.  Infected heel of left foot. Neurologic: CN 2-12 grossly intact. Sensation intact, DTR normal. Strength 5/5 in all 4.  Psychiatric: Normal judgment and insight. Alert and oriented x 3. Normal mood.   (Anything < 9 systems with 2 bullets each down codes to level 1) (If patient refuses exam cant bill higher level) (Make sure to document decubitus ulcers present on admission -- if possible -- and whether patient has chronic indwelling catheter at time of admission)  Labs on Admission: I have personally reviewed following labs and imaging studies  CBC: Recent Labs  Lab 12/08/19 1754  WBC 17.9*  NEUTROABS 16.4*  HGB 10.0*  HCT 33.5*  MCV 94.6  PLT 350*   Basic Metabolic Panel: Recent Labs  Lab 12/08/19 1754  NA 138  K 3.8  CL 103  CO2 20*  GLUCOSE 196*  BUN 56*  CREATININE 3.55*  CALCIUM 8.3*   GFR: Estimated Creatinine Clearance: 22.8 mL/min (A) (by  C-G formula based on SCr of 3.55 mg/dL (H)). Liver Function Tests: Recent Labs  Lab 12/08/19 1754  AST 19  ALT 14  ALKPHOS 102  BILITOT 0.6  PROT 8.1  ALBUMIN 2.9*   No results for input(s): LIPASE, AMYLASE in the last 168 hours. No results for input(s): AMMONIA in the last 168 hours. Coagulation Profile: No results for input(s): INR, PROTIME in the last 168 hours. Cardiac Enzymes: No results for input(s): CKTOTAL, CKMB, CKMBINDEX, TROPONINI in the last 168 hours. BNP (last 3 results) No results for input(s): PROBNP in the last 8760 hours. HbA1C: No results for input(s): HGBA1C in the last 72 hours. CBG: Recent Labs  Lab 12/08/19 2150  GLUCAP 161*   Lipid Profile: No results for input(s): CHOL, HDL, LDLCALC, TRIG, CHOLHDL, LDLDIRECT in the last 72 hours. Thyroid Function Tests: No results for input(s): TSH, T4TOTAL, FREET4, T3FREE, THYROIDAB in the last 72 hours. Anemia Panel: No results for input(s): VITAMINB12,  FOLATE, FERRITIN, TIBC, IRON, RETICCTPCT in the last 72 hours. Urine analysis: No results found for: COLORURINE, APPEARANCEUR, LABSPEC, PHURINE, GLUCOSEU, HGBUR, BILIRUBINUR, KETONESUR, PROTEINUR, UROBILINOGEN, NITRITE, LEUKOCYTESUR  Radiological Exams on Admission: MR FOOT LEFT WO CONTRAST  Result Date: 12/08/2019 CLINICAL DATA:  Diabetic foot swelling, possible osteomyelitis EXAM: MRI OF THE LEFT FOOT WITHOUT CONTRAST TECHNIQUE: Multiplanar, multisequence MR imaging of the ankle was performed. No intravenous contrast was administered. COMPARISON:  None. FINDINGS: TENDONS Peroneal: Mild common peroneus tenosynovitis. Posteromedial: Distal tibialis posterior tenosynovitis and mild distal tibialis posterior tendinopathy. Mild flexor hallucis longus tenosynovitis just proximal to the knot of Henry. Anterior: Unremarkable Achilles: Distal Achilles tendinopathy with expansion of the distal Achilles tendon and multiple spurs and ossicles in the calcaneus along the distal Achilles attachment site. Plantar Fascia: There is discontinuity in both the medial and lateral bands of the plantar fascia proximally due to a large fluid collection which extends into the calcaneus and drains down to the plantar surface of the foot. Appearance is compatible with partial rupture. LIGAMENTS Lateral: Attenuated distal attachment of the anterior talofibular ligament, query remote injury. Thickened calcaneofibular ligament. Medial: Thickened tibiospring and tibionavicular components of the deltoid ligament. CARTILAGE Ankle Joint: 0.8 by 1.1 by 0.6 cm non-fragmented osteochondral lesion of the lateral talar dome, image 18/5. Mild degenerative chondral thinning. Subtalar Joints/Sinus Tarsi: Small effusion of the posterior subtalar facet. Spurring of the subtalar joint. Bones: Abnormal fluid collection in the calcaneus measuring 3.5 by 2.4 by 2.2 cm, likely chronic, with surrounding mild marrow edema in the calcaneus. This fluid  collection is continuous with a soft tissue fluid collection extending in region of the mostly ruptured plantar fascia. There is a draining sinus tract to the plantar heel on image 10/6. Degenerative and possibly erosive findings in the midfoot and Lisfranc joint but no other regions compelling for active osteomyelitis. Other: Diffuse striking subcutaneous edema in the ankle, possibly from cellulitis. Diffuse muscular edema, especially along the plantar hindfoot and midfoot, which could be neurogenic edema or myositis. IMPRESSION: 1. 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 2. Diffuse striking subcutaneous edema in the ankle, possibly from cellulitis. 3. Diffuse muscular edema, especially along the plantar hindfoot and midfoot, which could be neurogenic edema or myositis. 4. Distal Achilles tendinopathy with expansion of the distal Achilles tendon and multiple spurs and ossicles along the distal Achilles  attachment site. 5. Distal tibialis posterior tenosynovitis and mild distal tibialis posterior tendinopathy. 6. Mild flexor hallucis longus tenosynovitis just proximal to the knot of Henry. 7. Mild common peroneus tenosynovitis. 8. Attenuated distal attachment of the anterior talofibular ligament, query remote injury. 9. Thickened tibiospring and tibionavicular components of the deltoid ligament, query remote injury. 10. 0.8 by 1.1 cm non-fragmented osteochondral lesion of the lateral talar dome. 11. Degenerative and possibly erosive findings in the midfoot and Lisfranc joint but no other regions compelling for active osteomyelitis. Electronically Signed   By: Van Clines M.D.   On: 12/08/2019 18:30     Assessment/Plan Principal Problem:   Osteomyelitis of foot, left, acute (HCC) Continue broad-spectrum  antibiotics coverage with vancomycin, IV cefepime and IV metronidazole. Tylenol as needed for fever and mild pain, Norco as needed for moderate pain and IV morphine 2 mg every 4 hours as needed for severe pain ordered. Podiatry/Dr. Caprice Beaver consult ordered for surgery in the morning. N.p.o. after midnight No chemical prophylaxis for DVT. Blood cultures ordered  Active Problems:   Hyperglycemia due to diabetes mellitus (HCC) Moderate dose sliding scale insulin ordered Blood glucose monitoring and hypoglycemic protocol in place    Acute kidney injury superimposed on chronic kidney disease (HCC) Creatinine is 3.55 but baseline creatinine is not known.  Patient reported that he has chronic kidney disease with high creatinine but does not know the baseline. IV normal saline at the rate of 75 mL/h ordered    Essential hypertension Blood pressure is stable.  Continue home amlodipine  Hyperlipidemia Continue home Lipitor  Chronic bilateral lower extremity edema Continue torsemide twice daily as prescribed  Depression Continue home duloxetine   (please populate well all problems here in Problem List. (For example, if patient is on BP meds at home and you resume or decide to hold them, it is a problem that needs to be her. Same for CAD, COPD, HLD and so on)     DVT prophylaxis: SCD Code Status: Full code Family Communication: No family member present at the bedside Disposition Plan:  Consults called: Podiatry/Dr. Caprice Beaver Admission status: Inpatient/MedSurg   Edmonia Lynch MD [facility] Pager [phone]   If 7PM-7AM, please contact night-coverage [email or web address] Password   12/09/2019, 4:46 AM

## 2019-12-09 NOTE — Progress Notes (Signed)
Pharmacy Antibiotic Note  Connor Taylor is a 69 y.o. male with osteo of heel seen on MRI  Seems to have CKD - unknown BL SCr but currently 3.55   Height: 5\' 6"  (167.6 cm) Weight: 112 kg (246 lb 14.6 oz) IBW/kg (Calculated) : 63.8  Temp (24hrs), Avg:98.8 F (37.1 C), Min:98.2 F (36.8 C), Max:99.3 F (37.4 C)  Recent Labs  Lab 12/08/19 1754  WBC 17.9*  CREATININE 3.55*    Estimated Creatinine Clearance: 22.8 mL/min (A) (by C-G formula based on SCr of 3.55 mg/dL (H)).    No Known Allergies  Plan: Flagyl 500 q8 Cefepime 2 g q24h Vanc 500 mg x 1 now (to add to previous 1 g dose) then 1500 q48 starting 6/7 0000 Monitor renal fx cx vanc lvls prn  Connor Taylor, PharmD, BCPS, BCCCP Clinical Pharmacist [phone]  Please check AMION for all [place] numbers  12/09/2019 4:36 AM

## 2019-12-10 DIAGNOSIS — Z9884 Bariatric surgery status: Secondary | ICD-10-CM

## 2019-12-10 DIAGNOSIS — IMO0002 Reserved for concepts with insufficient information to code with codable children: Secondary | ICD-10-CM | POA: Diagnosis present

## 2019-12-10 LAB — BASIC METABOLIC PANEL
Anion gap: 10 (ref 5–15)
BUN: 56 mg/dL — ABNORMAL HIGH (ref 8–23)
CO2: 21 mmol/L — ABNORMAL LOW (ref 22–32)
Calcium: 7.7 mg/dL — ABNORMAL LOW (ref 8.9–10.3)
Chloride: 108 mmol/L (ref 98–111)
Creatinine, Ser: 3.55 mg/dL — ABNORMAL HIGH (ref 0.61–1.24)
GFR calc Af Amer: 19 mL/min — ABNORMAL LOW (ref >60)
GFR calc non Af Amer: 16 mL/min — ABNORMAL LOW (ref >60)
Glucose, Bld: 116 mg/dL — ABNORMAL HIGH (ref 70–99)
Potassium: 4.2 mmol/L (ref 3.5–5.1)
Sodium: 139 mmol/L (ref 135–145)

## 2019-12-10 LAB — GLUCOSE, CAPILLARY
Glucose-Capillary: 100 mg/dL — ABNORMAL HIGH (ref 70–99)
Glucose-Capillary: 148 mg/dL — ABNORMAL HIGH (ref 70–99)
Glucose-Capillary: 174 mg/dL — ABNORMAL HIGH (ref 70–99)
Glucose-Capillary: 188 mg/dL — ABNORMAL HIGH (ref 70–99)

## 2019-12-10 LAB — SURGICAL PCR SCREEN
MRSA, PCR: NEGATIVE
Staphylococcus aureus: NEGATIVE

## 2019-12-10 MED ORDER — INSULIN ASPART 100 UNIT/ML ~~LOC~~ SOLN: 0.0000 [IU] | Freq: Three times a day (TID) | SUBCUTANEOUS | Status: DC

## 2019-12-10 MED ORDER — INSULIN ASPART 100 UNIT/ML ~~LOC~~ SOLN: 0.0000 [IU] | Freq: Every day | SUBCUTANEOUS | Status: DC

## 2019-12-10 NOTE — Progress Notes (Signed)
Patient Demographics:    Connor Taylor, is a 69 y.o. male, DOB - Apr 23, 1951, FGH:[id number]  Admit date - 12/08/2019   Admitting Physician Edmonia Lynch, DO  Outpatient Primary MD for the patient is System, Provider Not In  LOS - 2   Chief Complaint  Patient presents with  . Foot Pain        Subjective:    Connor Taylor today has no fevers, no emesis,  No chest pain,   -- -Resting comfortably -  Assessment  & Plan :    Principal Problem:   Osteomyelitis of foot, left, acute (HCC) Active Problems:   Acute kidney injury superimposed on chronic kidney disease (HCC)   Diabetes mellitus type 2, uncontrolled ([place])   Essential hypertension   H/O gastric bypass  Brief Summary:- 69 y.o.malewith medical history significant ofhypertension, hyperlipidemia, diabetes mellitus and chronic kidney disease patient of Dr. Kalman Shan admitted on 12/09/2019 with left foot cellulitis and osteomyelitis -to OR on 12/11/19 for I and D     A/p 1)Osteomyelitis of the left foot/left heel/left calcaneus--  MRI of left foot consistent with osteomyelitis C/n -Flagyl 500 q8, Cefepime 2 g q24h started 6/5/21and Vancomycin  1500 q48 starting 6/7 0000 Monitor renal fx cx vanc lvls prn -CRP 25 and ESR is 120 - to OR on 12/11/2019 for I&D and deep wound cultures -Discussed with podiatrist Dr. 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--prior labs not available creatinine currently in the 3.5 range  renally adjust medications, avoid nephrotoxic agents / dehydration  / hypotension  4)HTN-stable, c/n amlodipine  5)  chronic lower extremity edema--- may benefit from Ace wraps, continue torsemide  6) depression and neuropathic pain--- Cymbalta as ordered  Disposition/Need for in-Hospital Stay- patient unable to be discharged at this time due to -left foot osteomyelitis requiring IV antibiotics and surgical drainage  Status is: Inpatient  Remains inpatient appropriate because:IV treatments appropriate due to intensity of illness or inability to take PO   Dispo: The patient is from: Home              Anticipated d/c is to: Home              Anticipated d/c date is: 2 days              Patient currently is not medically stable to d/c. Barriers: Not Clinically Stable- --left foot osteomyelitis requiring IV antibiotics and surgical drainage  Code Status : full  Family Communication:   NA (patient is alert, awake and coherent)  Consults  :  podiatry  DVT Prophylaxis  :    - Heparin - SCDs   Lab Results  Component Value Date   PLT 402 (H) 12/09/2019    Inpatient Medications  Scheduled Meds: . amLODipine  5 mg Oral q morning - 10a  . atorvastatin  20 mg Oral Daily  . DULoxetine  60 mg Oral Daily  . insulin aspart  0-5 Units Subcutaneous QHS  . insulin aspart  0-6 Units Subcutaneous TID WC  . pantoprazole  40 mg Oral BID  . torsemide  40 mg Oral BID   Continuous Infusions: . sodium chloride 10 mL/hr at 12/10/19 0300  . ceFEPime (MAXIPIME) IV Stopped (12/09/19 1845)  . metronidazole 500 mg (12/10/19 1214)  . [START ON 12/11/2019] vancomycin     PRN Meds:.acetaminophen **OR** acetaminophen, HYDROcodone-acetaminophen, morphine injection, ondansetron **OR** ondansetron (ZOFRAN) IV    Anti-infectives (From admission, onward)   Start     Dose/Rate Route Frequency Ordered Stop   12/11/19 0000  vancomycin (VANCOREADY) IVPB 1500 mg/300 mL     1,500 mg 150 mL/hr over 120 Minutes Intravenous Every 48 hours 12/09/19 0434     12/09/19 1800  ceFEPIme (MAXIPIME) 2 g in sodium chloride 0.9 % 100 mL  IVPB     2 g 200 mL/hr over 30 Minutes Intravenous Every 24 hours 12/09/19 0434     12/09/19 0445  vancomycin (VANCOREADY) IVPB 500 mg/100 mL     500 mg 100 mL/hr over 60 Minutes Intravenous  Once 12/09/19 0434 12/09/19 0651   12/09/19 0430  metroNIDAZOLE (FLAGYL) IVPB 500 mg    Note to Pharmacy: Already got first dose in the ED.  Kindly adjust the timing for next dose   500 mg 100 mL/hr over 60 Minutes Intravenous Every 8 hours 12/09/19 0419     12/08/19 1930  vancomycin (VANCOCIN) IVPB 1000 mg/200 mL premix     1,000 mg 200 mL/hr over 60 Minutes Intravenous  Once 12/08/19 1915 12/08/19 2128   12/08/19 1930  metroNIDAZOLE (FLAGYL) IVPB 500 mg     500 mg 100 mL/hr over 60 Minutes Intravenous  Once 12/08/19 1915 12/08/19 2127   12/08/19 1930  ceFEPIme (MAXIPIME) 2 g in sodium chloride 0.9 % 100 mL IVPB     2 g 200 mL/hr over 30 Minutes Intravenous  Once 12/08/19 1916 12/08/19 2038        Objective:   Vitals:   12/09/19 2056 12/10/19 0245 12/10/19 1119 12/10/19 1433  BP: (!) 144/64 115/62 (!) 142/71 (!) 124/58  Pulse: 88 86 81 95  Resp: 18 16  20   Temp: 98.8 F (37.1 C) 99 F (37.2 C)  98.1 F (36.7 C)  TempSrc: Oral Oral  Oral  SpO2: 93% 92% 92% (!) 89%  Weight:      Height:        Wt Readings from Last 3 Encounters:  12/08/19 112 kg     Intake/Output Summary (Last 24 hours) at 12/10/2019 1455 Last data filed at 12/10/2019 1432 Gross per 24 hour  Intake 1577.26 ml  Output 4100 ml  Net -2522.74 ml     Physical Exam  Gen:- Awake Alert,  In no apparent distress  HEENT:- [place].AT, No sclera icterus Neck-Supple Neck,No JVD,.  Lungs-  CTAB , fair symmetrical air movement CV- S1, S2 normal, regular  Abd-  +ve B.Sounds, Abd Soft, No tenderness,    Extremity/Skin:- +ve  edema, pedal pulses present , - Lt foot -There is a focal ulceration along the central plantar aspect of the left heel.  The wound bed is fibrotic.  There is periwound erythema with an area of fluctuance  posterior medial aspect of the ulceration extending to the lateral aspect of the left heel.   Psych-affect is appropriate, oriented x3 Neuro-no new focal deficits, no tremors   Data Review:   Micro Results Recent Results (from the past 240 hour(s))  Culture, blood (Routine X 2) w Reflex to ID  Panel     Status: None (Preliminary result)   Collection Time: 12/08/19  5:55 PM   Specimen: Left Antecubital; Blood  Result Value Ref Range Status   Specimen Description LEFT ANTECUBITAL  Final   Special Requests   Final    BOTTLES DRAWN AEROBIC AND ANAEROBIC Blood Culture adequate volume   Culture   Final    NO GROWTH < 12 HOURS Performed at [facility], [address], [place], [place] [address]    Report Status PENDING  Incomplete  Culture, blood (Routine X 2) w Reflex to ID Panel     Status: None (Preliminary result)   Collection Time: 12/08/19  5:55 PM   Specimen: Right Antecubital; Blood  Result Value Ref Range Status   Specimen Description RIGHT ANTECUBITAL  Final   Special Requests   Final    BOTTLES DRAWN AEROBIC AND ANAEROBIC Blood Culture adequate volume   Culture   Final    NO GROWTH < 12 HOURS Performed at [facility], [address] [place], [place] [address]    Report Status PENDING  Incomplete  SARS Coronavirus 2 by RT PCR (hospital order, performed in [facility] hospital lab) Nasopharyngeal Nasopharyngeal Swab     Status: None   Collection Time: 12/08/19  7:09 PM   Specimen: Nasopharyngeal Swab  Result Value Ref Range Status   SARS Coronavirus 2 NEGATIVE NEGATIVE Final    Comment: (NOTE) SARS-CoV-2 target nucleic acids are NOT DETECTED. The SARS-CoV-2 RNA is generally detectable in upper and lower respiratory specimens during the acute phase of infection. The lowest concentration of SARS-CoV-2 viral copies this assay can detect is 250 copies / mL. A negative result does not preclude SARS-CoV-2 infection and should not be used as the sole basis for treatment or  other patient management decisions.  A negative result may occur with improper specimen collection / handling, submission of specimen other than nasopharyngeal swab, presence of viral mutation(s) within the areas targeted by this assay, and inadequate number of viral copies (<250 copies / mL). A negative result must be combined with clinical observations, patient history, and epidemiological information. Fact Sheet for Patients:   StrictlyIdeas.no Fact Sheet for Healthcare Providers: BankingDealers.co.za This test is not yet approved or cleared  by the [place] FDA and has been authorized for detection and/or diagnosis of SARS-CoV-2 by FDA under an Emergency Use Authorization (EUA).  This EUA will remain in effect (meaning this test can be used) for the duration of the COVID-19 declaration under Section 564(b)(1) of the Act, 21 U.S.C. section 360bbb-3(b)(1), unless the authorization is terminated or revoked sooner. Performed at [facility], [address], [place], [place] [address]     Radiology Reports MR FOOT LEFT WO CONTRAST  Result Date: 12/08/2019 CLINICAL DATA:  Diabetic foot swelling, possible osteomyelitis EXAM: MRI OF THE LEFT FOOT WITHOUT CONTRAST TECHNIQUE: Multiplanar, multisequence MR imaging of the ankle was performed. No intravenous contrast was administered. COMPARISON:  None. FINDINGS: TENDONS Peroneal: Mild common peroneus tenosynovitis. Posteromedial: Distal tibialis posterior tenosynovitis and mild distal tibialis posterior tendinopathy. Mild flexor hallucis longus tenosynovitis just proximal to the knot of Henry. Anterior: Unremarkable Achilles: Distal Achilles tendinopathy with expansion of the distal Achilles tendon and multiple spurs and ossicles in the calcaneus along the distal Achilles attachment site. Plantar Fascia: There is discontinuity in both the medial and lateral bands of the plantar fascia proximally due  to a large fluid collection which extends into the calcaneus and drains down to the plantar surface  of the foot. Appearance is compatible with partial rupture. LIGAMENTS Lateral: Attenuated distal attachment of the anterior talofibular ligament, query remote injury. Thickened calcaneofibular ligament. Medial: Thickened tibiospring and tibionavicular components of the deltoid ligament. CARTILAGE Ankle Joint: 0.8 by 1.1 by 0.6 cm non-fragmented osteochondral lesion of the lateral talar dome, image 18/5. Mild degenerative chondral thinning. Subtalar Joints/Sinus Tarsi: Small effusion of the posterior subtalar facet. Spurring of the subtalar joint. Bones: Abnormal fluid collection in the calcaneus measuring 3.5 by 2.4 by 2.2 cm, likely chronic, with surrounding mild marrow edema in the calcaneus. This fluid collection is continuous with a soft tissue fluid collection extending in region of the mostly ruptured plantar fascia. There is a draining sinus tract to the plantar heel on image 10/6. Degenerative and possibly erosive findings in the midfoot and Lisfranc joint but no other regions compelling for active osteomyelitis. Other: Diffuse striking subcutaneous edema in the ankle, possibly from cellulitis. Diffuse muscular edema, especially along the plantar hindfoot and midfoot, which could be neurogenic edema or myositis. IMPRESSION: 1. 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 2. Diffuse striking subcutaneous edema in the ankle, possibly from cellulitis. 3. Diffuse muscular edema, especially along the plantar hindfoot and midfoot, which could be neurogenic edema or myositis. 4. Distal Achilles tendinopathy with expansion of the distal Achilles tendon and multiple spurs and ossicles along the distal Achilles attachment  site. 5. Distal tibialis posterior tenosynovitis and mild distal tibialis posterior tendinopathy. 6. Mild flexor hallucis longus tenosynovitis just proximal to the knot of Henry. 7. Mild common peroneus tenosynovitis. 8. Attenuated distal attachment of the anterior talofibular ligament, query remote injury. 9. Thickened tibiospring and tibionavicular components of the deltoid ligament, query remote injury. 10. 0.8 by 1.1 cm non-fragmented osteochondral lesion of the lateral talar dome. 11. Degenerative and possibly erosive findings in the midfoot and Lisfranc joint but no other regions compelling for active osteomyelitis. Electronically Signed   By: Van Clines M.D.   On: 12/08/2019 18:30     CBC Recent Labs  Lab 12/08/19 1754 12/09/19 0614  WBC 17.9* 13.0*  HGB 10.0* 8.4*  HCT 33.5* 28.1*  PLT 467* 402*  MCV 94.6 92.4  MCH 28.2 27.6  MCHC 29.9* 29.9*  RDW 15.9* 15.6*  LYMPHSABS 0.4*  --   MONOABS 0.9  --   EOSABS 0.0  --   BASOSABS 0.0  --     Chemistries  Recent Labs  Lab 12/08/19 1754 12/09/19 0614 12/10/19 0445  NA 138 140 139  K 3.8 3.4* 4.2  CL 103 106 108  CO2 20* 22 21*  GLUCOSE 196* 105* 116*  BUN 56* 55* 56*  CREATININE 3.55* 3.50* 3.55*  CALCIUM 8.3* 7.9* 7.7*  AST 19 20  --   ALT 14 12  --   ALKPHOS 102 77  --   BILITOT 0.6 0.8  --    ------------------------------------------------------------------------------------------------------------------ No results for input(s): CHOL, HDL, LDLCALC, TRIG, CHOLHDL, LDLDIRECT in the last 72 hours.  Lab Results  Component Value Date   HGBA1C 7.2 (H) 12/08/2019   ------------------------------------------------------------------------------------------------------------------ No results for input(s): TSH, T4TOTAL, T3FREE, THYROIDAB in the last 72 hours.  Invalid input(s): FREET3 ------------------------------------------------------------------------------------------------------------------ No results for  input(s): VITAMINB12, FOLATE, FERRITIN, TIBC, IRON, RETICCTPCT in the last 72 hours.  Coagulation profile No results for input(s): INR, PROTIME in the  last 168 hours.  No results for input(s): DDIMER in the last 72 hours.  Cardiac Enzymes No results for input(s): CKMB, TROPONINI, MYOGLOBIN in the last 168 hours.  Invalid input(s): CK ------------------------------------------------------------------------------------------------------------------ No results found for: BNP   Roxan Hockey M.D on 12/10/2019 at 2:55 PM  Go to [email or web address] - for contact info  [facility] - Office  [phone]

## 2019-12-10 NOTE — Progress Notes (Signed)
PODIATRY PROGRESS NOTE  SUBJECTIVE This 69 year old male is being followed for an ulceration of his left heel with underlying abscess and osteomyelitis of the calcaneus.  He continues to experience pain in his left foot.  He denies any fevers, chills, shortness of breath or chest pain.  MEDICATIONS Scheduled Meds: . amLODipine  2.5 mg Oral QHS  . amLODipine  5 mg Oral q morning - 10a  . atorvastatin  20 mg Oral Daily  . DULoxetine  60 mg Oral Daily  . insulin aspart  0-15 Units Subcutaneous TID WC  . insulin aspart  0-5 Units Subcutaneous QHS  . pantoprazole  40 mg Oral BID  . torsemide  40 mg Oral BID   Continuous Infusions: . sodium chloride 10 mL/hr at 12/10/19 0300  . ceFEPime (MAXIPIME) IV Stopped (12/09/19 1845)  . metronidazole 500 mg (12/10/19 1214)  . [START ON 12/11/2019] vancomycin     PRN Meds:.acetaminophen **OR** acetaminophen, HYDROcodone-acetaminophen, morphine injection, ondansetron **OR** ondansetron (ZOFRAN) IV  OBJECTIVE Vital signs in last 24 hours:   Temp:  [98.3 F (36.8 C)-99 F (37.2 C)] 99 F (37.2 C) (06/06 0245) Pulse Rate:  [81-89] 81 (06/06 1119) Resp:  [16-18] 16 (06/06 0245) BP: (106-144)/(49-71) 142/71 (06/06 1119) SpO2:  [92 %-93 %] 92 % (06/06 1119)  DRESSING: Clean, dry and intact without strikethrough. INTEGUMENT: The skin of both feet is warm.  There is a focal ulceration along the central plantar aspect of the left heel.  The wound bed is fibrotic.  There is periwound erythema with an area of fluctuance posterior medial aspect of the ulceration extending to the lateral aspect of the left heel.  The erythema has improved since yesterday.  The area is exquisitely tender to palpation. VASCULAR: The left dorsalis pedis pulse is palpable.  I was unable to palpate the left posterior tibial pulse due to presence of edema.  The right dorsalis pedis pulse is palpable.  I was unable to palpate the right posterior tibial pulse due to presence of edema.   There is significant edema of both lower extremities (left greater than right). NEUROLOGIC: Protective sensation is diminished bilaterally.  LAB/TEST RESULTS Recent Labs    12/08/19 1754 12/08/19 1754 12/09/19 0614 12/10/19 0445  WBC 17.9*  --  13.0*  --   HGB 10.0*  --  8.4*  --   HCT 33.5*  --  28.1*  --   PLT 467*  --  402*  --   NA 138   < > 140 139  K 3.8   < > 3.4* 4.2  CL 103   < > 106 108  CO2 20*   < > 22 21*  BUN 56*   < > 55* 56*  CREATININE 3.55*   < > 3.50* 3.55*  GLUCOSE 196*   < > 105* 116*  CALCIUM 8.3*   < > 7.9* 7.7*   < > = values in this interval not displayed.   CRP:  24.9 ESR:  120 HbA1c:  7.2  Recent Results (from the past 240 hour(s))  Culture, blood (Routine X 2) w Reflex to ID Panel     Status: None (Preliminary result)   Collection Time: 12/08/19  5:55 PM   Specimen: Left Antecubital; Blood  Result Value Ref Range Status   Specimen Description LEFT ANTECUBITAL  Final   Special Requests   Final    BOTTLES DRAWN AEROBIC AND ANAEROBIC Blood Culture adequate volume   Culture   Final  NO GROWTH < 12 HOURS Performed at [facility], [address] [place], [place] [address]    Report Status PENDING  Incomplete  Culture, blood (Routine X 2) w Reflex to ID Panel     Status: None (Preliminary result)   Collection Time: 12/08/19  5:55 PM   Specimen: Right Antecubital; Blood  Result Value Ref Range Status   Specimen Description RIGHT ANTECUBITAL  Final   Special Requests   Final    BOTTLES DRAWN AEROBIC AND ANAEROBIC Blood Culture adequate volume   Culture   Final    NO GROWTH < 12 HOURS Performed at [facility], [address], [place], [place] [address]    Report Status PENDING  Incomplete  SARS Coronavirus 2 by RT PCR (hospital order, performed in [facility] hospital lab) Nasopharyngeal Nasopharyngeal Swab     Status: None   Collection Time: 12/08/19  7:09 PM   Specimen: Nasopharyngeal Swab  Result Value Ref Range Status   SARS  Coronavirus 2 NEGATIVE NEGATIVE Final    Comment: (NOTE) SARS-CoV-2 target nucleic acids are NOT DETECTED. The SARS-CoV-2 RNA is generally detectable in upper and lower respiratory specimens during the acute phase of infection. The lowest concentration of SARS-CoV-2 viral copies this assay can detect is 250 copies / mL. A negative result does not preclude SARS-CoV-2 infection and should not be used as the sole basis for treatment or other patient management decisions.  A negative result may occur with improper specimen collection / handling, submission of specimen other than nasopharyngeal swab, presence of viral mutation(s) within the areas targeted by this assay, and inadequate number of viral copies (<250 copies / mL). A negative result must be combined with clinical observations, patient history, and epidemiological information. Fact Sheet for Patients:   StrictlyIdeas.no Fact Sheet for Healthcare Providers: BankingDealers.co.za This test is not yet approved or cleared  by the [place] FDA and has been authorized for detection and/or diagnosis of SARS-CoV-2 by FDA under an Emergency Use Authorization (EUA).  This EUA will remain in effect (meaning this test can be used) for the duration of the COVID-19 declaration under Section 564(b)(1) of the Act, 21 U.S.C. section 360bbb-3(b)(1), unless the authorization is terminated or revoked sooner. Performed at [facility], [address], [place], [place] [address]      MR FOOT LEFT WO CONTRAST  Result Date: 12/08/2019 CLINICAL DATA:  Diabetic foot swelling, possible osteomyelitis EXAM: MRI OF THE LEFT FOOT WITHOUT CONTRAST TECHNIQUE: Multiplanar, multisequence MR imaging of the ankle was performed. No intravenous contrast was administered. COMPARISON:  None. FINDINGS: TENDONS Peroneal: Mild common peroneus tenosynovitis. Posteromedial: Distal tibialis posterior tenosynovitis and mild  distal tibialis posterior tendinopathy. Mild flexor hallucis longus tenosynovitis just proximal to the knot of Henry. Anterior: Unremarkable Achilles: Distal Achilles tendinopathy with expansion of the distal Achilles tendon and multiple spurs and ossicles in the calcaneus along the distal Achilles attachment site. Plantar Fascia: There is discontinuity in both the medial and lateral bands of the plantar fascia proximally due to a large fluid collection which extends into the calcaneus and drains down to the plantar surface of the foot. Appearance is compatible with partial rupture. LIGAMENTS Lateral: Attenuated distal attachment of the anterior talofibular ligament, query remote injury. Thickened calcaneofibular ligament. Medial: Thickened tibiospring and tibionavicular components of the deltoid ligament. CARTILAGE Ankle Joint: 0.8 by 1.1 by 0.6 cm non-fragmented osteochondral lesion of the lateral talar dome, image 18/5. Mild degenerative chondral thinning. Subtalar Joints/Sinus Tarsi: Small effusion of the posterior  subtalar facet. Spurring of the subtalar joint. Bones: Abnormal fluid collection in the calcaneus measuring 3.5 by 2.4 by 2.2 cm, likely chronic, with surrounding mild marrow edema in the calcaneus. This fluid collection is continuous with a soft tissue fluid collection extending in region of the mostly ruptured plantar fascia. There is a draining sinus tract to the plantar heel on image 10/6. Degenerative and possibly erosive findings in the midfoot and Lisfranc joint but no other regions compelling for active osteomyelitis. Other: Diffuse striking subcutaneous edema in the ankle, possibly from cellulitis. Diffuse muscular edema, especially along the plantar hindfoot and midfoot, which could be neurogenic edema or myositis. IMPRESSION: 1. Chronic abscess/osteomyelitis in the calcaneus with surrounding mild marrow edema, continuous with a plantar fluid collection in the vicinity of the ruptured  plantar fascia which has a draining sinus tract to the plantar surface of the foot. 2. Diffuse striking subcutaneous edema in the ankle, possibly from cellulitis. 3. Diffuse muscular edema, especially along the plantar hindfoot and midfoot, which could be neurogenic edema or myositis. 4. Distal Achilles tendinopathy with expansion of the distal Achilles tendon and multiple spurs and ossicles along the distal Achilles attachment site. 5. Distal tibialis posterior tenosynovitis and mild distal tibialis posterior tendinopathy. 6. Mild flexor hallucis longus tenosynovitis just proximal to the knot of Henry. 7. Mild common peroneus tenosynovitis. 8. Attenuated distal attachment of the anterior talofibular ligament, query remote injury. 9. Thickened tibiospring and tibionavicular components of the deltoid ligament, query remote injury. 10. 0.8 by 1.1 cm non-fragmented osteochondral lesion of the lateral talar dome. 11. Degenerative and possibly erosive findings in the midfoot and Lisfranc joint but no other regions compelling for active osteomyelitis. Electronically Signed   By: Van Clines M.D.   On: 12/08/2019 18:30   ASSESSMENT 1.  Abscess, left heel. 2.  Chronic osteomyelitis, left calcaneus. 3.  Diabetes mellitus with peripheral neuropathy. 4.  Chronic kidney disease.  PLAN I applied a dry dressing to his left foot.  I will proceed with incision and drainage of the left heel with possible application of wound VAC tomorrow.  Deep cultures will be obtained.  The benefits and risk of the procedure were explained.  He expresses understanding and agrees with this course of care.  An order was placed to obtain a written consent.  He has been placed n.p.o. after midnight.  An EKG has been ordered.  Continue with current IV antibiotics per hospitalist service.  Brita Romp 12/10/2019, 12:54 PM

## 2019-12-10 NOTE — Plan of Care (Signed)

## 2019-12-11 ENCOUNTER — Encounter (HOSPITAL_COMMUNITY)
Admission: EM | Disposition: A | Discharge status: Home Health | Payer: Self-pay | Source: Home / Self Care | Attending: Family Medicine

## 2019-12-11 ENCOUNTER — Inpatient Hospital Stay (HOSPITAL_COMMUNITY): Payer: Medicare Other | Admitting: Anesthesiology

## 2019-12-11 ENCOUNTER — Encounter (HOSPITAL_COMMUNITY): Payer: Self-pay | Admitting: Internal Medicine

## 2019-12-11 HISTORY — PX: APPLICATION OF WOUND VAC: SHX5189

## 2019-12-11 HISTORY — PX: INCISION AND DRAINAGE: SHX5863

## 2019-12-11 LAB — CBC
HCT: 29.7 % — ABNORMAL LOW (ref 39.0–52.0)
Hemoglobin: 9.1 g/dL — ABNORMAL LOW (ref 13.0–17.0)
MCH: 28.3 pg (ref 26.0–34.0)
MCHC: 30.6 g/dL (ref 30.0–36.0)
MCV: 92.2 fL (ref 80.0–100.0)
Platelets: 415 10*3/uL — ABNORMAL HIGH (ref 150–400)
RBC: 3.22 MIL/uL — ABNORMAL LOW (ref 4.22–5.81)
RDW: 15.9 % — ABNORMAL HIGH (ref 11.5–15.5)
WBC: 12.3 10*3/uL — ABNORMAL HIGH (ref 4.0–10.5)
nRBC: 0 % (ref 0.0–0.2)

## 2019-12-11 LAB — BASIC METABOLIC PANEL
Anion gap: 11 (ref 5–15)
BUN: 59 mg/dL — ABNORMAL HIGH (ref 8–23)
CO2: 22 mmol/L (ref 22–32)
Calcium: 7.9 mg/dL — ABNORMAL LOW (ref 8.9–10.3)
Chloride: 105 mmol/L (ref 98–111)
Creatinine, Ser: 3.73 mg/dL — ABNORMAL HIGH (ref 0.61–1.24)
GFR calc Af Amer: 18 mL/min — ABNORMAL LOW (ref >60)
GFR calc non Af Amer: 15 mL/min — ABNORMAL LOW (ref >60)
Glucose, Bld: 135 mg/dL — ABNORMAL HIGH (ref 70–99)
Potassium: 4 mmol/L (ref 3.5–5.1)
Sodium: 138 mmol/L (ref 135–145)

## 2019-12-11 LAB — GLUCOSE, CAPILLARY
Glucose-Capillary: 122 mg/dL — ABNORMAL HIGH (ref 70–99)
Glucose-Capillary: 126 mg/dL — ABNORMAL HIGH (ref 70–99)
Glucose-Capillary: 113 mg/dL — ABNORMAL HIGH (ref 70–99)
Glucose-Capillary: 135 mg/dL — ABNORMAL HIGH (ref 70–99)
Glucose-Capillary: 159 mg/dL — ABNORMAL HIGH (ref 70–99)

## 2019-12-11 SURGERY — INCISION AND DRAINAGE
Anesthesia: General | Site: Foot | Laterality: Left

## 2019-12-11 MED ORDER — LACTATED RINGERS IV SOLN: Freq: Once | INTRAVENOUS | Status: AC

## 2019-12-11 MED ORDER — LIDOCAINE HCL (PF) 1 % IJ SOLN
INTRAMUSCULAR | Status: AC
  Filled 2019-12-11: qty 30

## 2019-12-11 MED ORDER — CHLORHEXIDINE GLUCONATE 0.12 % MT SOLN
15.0000 mL | Freq: Once | OROMUCOSAL | Status: AC
  Administered 2019-12-11: 15 mL via OROMUCOSAL

## 2019-12-11 MED ORDER — PROPOFOL 10 MG/ML IV BOLUS
INTRAVENOUS | Status: DC | PRN
  Administered 2019-12-11: 30 mg via INTRAVENOUS

## 2019-12-11 MED ORDER — 0.9 % SODIUM CHLORIDE (POUR BTL) OPTIME
TOPICAL | Status: DC | PRN
  Administered 2019-12-11: 1000 mL

## 2019-12-11 MED ORDER — HYDROMORPHONE HCL 1 MG/ML IJ SOLN
0.2500 mg | INTRAMUSCULAR | Status: DC | PRN
  Administered 2019-12-11: 0.5 mg via INTRAVENOUS
  Filled 2019-12-11: qty 0.5

## 2019-12-11 MED ORDER — PROPOFOL 10 MG/ML IV BOLUS
INTRAVENOUS | Status: AC
  Filled 2019-12-11: qty 40

## 2019-12-11 MED ORDER — PROPOFOL 500 MG/50ML IV EMUL
INTRAVENOUS | Status: DC | PRN
  Administered 2019-12-11: 75 ug/kg/min via INTRAVENOUS

## 2019-12-11 MED ORDER — MEPERIDINE HCL 50 MG/ML IJ SOLN: 6.2500 mg | INTRAMUSCULAR | Status: DC | PRN

## 2019-12-11 MED ORDER — IPRATROPIUM-ALBUTEROL 0.5-2.5 (3) MG/3ML IN SOLN
RESPIRATORY_TRACT | Status: AC
  Filled 2019-12-11: qty 3

## 2019-12-11 MED ORDER — SODIUM CHLORIDE 0.9 % IR SOLN
Status: DC | PRN
  Administered 2019-12-11: 3000 mL

## 2019-12-11 MED ORDER — IPRATROPIUM-ALBUTEROL 0.5-2.5 (3) MG/3ML IN SOLN
3.0000 mL | RESPIRATORY_TRACT | Status: DC
  Administered 2019-12-11: 3 mL via RESPIRATORY_TRACT

## 2019-12-11 MED ORDER — CHLORHEXIDINE GLUCONATE 0.12 % MT SOLN
OROMUCOSAL | Status: AC
  Filled 2019-12-11: qty 15

## 2019-12-11 MED ORDER — LACTATED RINGERS IV SOLN
INTRAVENOUS | Status: DC | PRN
Start: 2019-12-11 — End: 2019-12-11

## 2019-12-11 MED ORDER — FENTANYL CITRATE (PF) 250 MCG/5ML IJ SOLN
INTRAMUSCULAR | Status: AC
  Filled 2019-12-11: qty 5

## 2019-12-11 MED ORDER — BUPIVACAINE HCL (PF) 0.5 % IJ SOLN
INTRAMUSCULAR | Status: AC
  Filled 2019-12-11: qty 30

## 2019-12-11 MED ORDER — ORAL CARE MOUTH RINSE: 15.0000 mL | Freq: Once | OROMUCOSAL | Status: AC

## 2019-12-11 MED ORDER — BUPIVACAINE HCL (PF) 0.5 % IJ SOLN
INTRAMUSCULAR | Status: DC | PRN
  Administered 2019-12-11: 20 mL

## 2019-12-11 SURGICAL SUPPLY — 33 items
BANDAGE ELASTIC 4 LF NS (GAUZE/BANDAGES/DRESSINGS) ×2 IMPLANT
BANDAGE ESMARK 4X12 BL STRL LF (DISPOSABLE) ×1 IMPLANT
BNDG ELASTIC 4X5.8 VLCR NS LF (GAUZE/BANDAGES/DRESSINGS) ×3 IMPLANT
BNDG ESMARK 4X12 BLUE STRL LF (DISPOSABLE) ×3
BNDG GAUZE ELAST 4 BULKY (GAUZE/BANDAGES/DRESSINGS) ×3 IMPLANT
CANISTER WOUND CARE 500ML ATS (WOUND CARE) ×2 IMPLANT
CLOTH BEACON ORANGE TIMEOUT ST (SAFETY) ×3 IMPLANT
COVER LIGHT HANDLE STERIS (MISCELLANEOUS) ×6 IMPLANT
COVER WAND RF STERILE (DRAPES) ×3 IMPLANT
CUFF TOURN SGL QUICK 24 (TOURNIQUET CUFF) ×2
CUFF TRNQT CYL 24X4X16.5-23 (TOURNIQUET CUFF) IMPLANT
DECANTER SPIKE VIAL GLASS SM (MISCELLANEOUS) ×3 IMPLANT
DRSG VAC ATS SM SENSATRAC (GAUZE/BANDAGES/DRESSINGS) ×2 IMPLANT
ELECT REM PT RETURN 9FT ADLT (ELECTROSURGICAL) ×3
ELECTRODE REM PT RTRN 9FT ADLT (ELECTROSURGICAL) ×1 IMPLANT
GAUZE SPONGE 4X4 12PLY STRL (GAUZE/BANDAGES/DRESSINGS) ×3 IMPLANT
GLOVE BIO SURGEON STRL SZ7.5 (GLOVE) ×3 IMPLANT
GLOVE BIOGEL M 7.0 STRL (GLOVE) ×2 IMPLANT
GLOVE BIOGEL PI IND STRL 7.0 (GLOVE) ×2 IMPLANT
GLOVE BIOGEL PI INDICATOR 7.0 (GLOVE) ×4
GOWN STRL REUS W/TWL LRG LVL3 (GOWN DISPOSABLE) ×6 IMPLANT
IV NS IRRIG 3000ML ARTHROMATIC (IV SOLUTION) ×2 IMPLANT
KIT TURNOVER KIT A (KITS) ×3 IMPLANT
MARKER SKIN DUAL TIP RULER LAB (MISCELLANEOUS) ×3 IMPLANT
NDL HYPO 27GX1-1/4 (NEEDLE) ×1 IMPLANT
NEEDLE HYPO 27GX1-1/4 (NEEDLE) ×6 IMPLANT
NS IRRIG 1000ML POUR BTL (IV SOLUTION) ×3 IMPLANT
PACK BASIC LIMB (CUSTOM PROCEDURE TRAY) ×3 IMPLANT
PAD ARMBOARD 7.5X6 YLW CONV (MISCELLANEOUS) ×3 IMPLANT
SET BASIN LINEN APH (SET/KITS/TRAYS/PACK) ×3 IMPLANT
SWAB CULTURE ESWAB REG 1ML (MISCELLANEOUS) ×2 IMPLANT
SWAB CULTURE LIQ STUART DBL (MISCELLANEOUS) ×2 IMPLANT
SYR CONTROL 10ML LL (SYRINGE) ×6 IMPLANT

## 2019-12-11 NOTE — Care Management Important Message (Signed)
Important Message  Patient Details  Name: Connor Taylor MRN: [id number] Date of Birth: 1950/12/01   Medicare Important Message Given:  Yes     Tommy Medal 12/11/2019, 3:51 PM

## 2019-12-11 NOTE — Anesthesia Preprocedure Evaluation (Addendum)
Anesthesia Evaluation  Patient identified by MRN, date of birth, ID band Patient awake    Reviewed: Allergy & Precautions, NPO status , Patient's Chart, lab work & pertinent test results  History of Anesthesia Complications Negative for: history of anesthetic complications  Airway Mallampati: III  TM Distance: >3 FB Neck ROM: Full    Dental  (+) Loose, Caps, Dental Advisory Given,    Pulmonary neg shortness of breath, sleep apnea, Continuous Positive Airway Pressure Ventilation and Oxygen sleep apnea ,  Saturation levels - 89- 91 on 3L  O2    Saturation levels - 89- 91 on 3L  O2   breath sounds clear to auscultation (-) decreased breath sounds      Cardiovascular Exercise Tolerance: Good hypertension, Pt. on medications  Rhythm:Regular Rate:Normal  10-Dec-2019 15:58:39 [facility] System-AP-300 ROUTINE RECORD Atrial fibrillation with rapid ventricular response with premature ventricular or aberrantly conducted complexes Right bundle branch block Left anterior fascicular block Bifascicular block T wave abnormality, consider lateral ischemia Abnormal ECG   Neuro/Psych negative neurological ROS  negative psych ROS   GI/Hepatic Neg liver ROS, H/o gastric bypass    Endo/Other  diabetes, Well Controlled, Type 2, Insulin DependentMorbid obesity  Renal/GU Renal InsufficiencyRenal disease     Musculoskeletal   Abdominal   Peds  Hematology   Anesthesia Other Findings   Reproductive/Obstetrics                         Anesthesia Physical Anesthesia Plan  ASA: IV  Anesthesia Plan: General   Post-op Pain Management:    Induction: Intravenous  PONV Risk Score and Plan:   Airway Management Planned: Nasal Cannula, Natural Airway and Simple Face Mask  Additional Equipment:   Intra-op Plan:   Post-operative Plan:   Informed Consent: I have reviewed the patients History and Physical,  chart, labs and discussed the procedure including the risks, benefits and alternatives for the proposed anesthesia with the patient or authorized representative who has indicated his/her understanding and acceptance.     Dental advisory given  Plan Discussed with: CRNA and Surgeon  Anesthesia Plan Comments: (Will give duoneb treatment before the procedure )       Anesthesia Quick Evaluation

## 2019-12-11 NOTE — Transfer of Care (Signed)
Immediate Anesthesia Transfer of Care Note  Patient: Connor Taylor  Procedure(s) Performed: INCISION AND DRAINAGE OF ABSCESS LEFT FOOT (HEEL) (Left Foot) APPLICATION OF WOUND VAC LEFT HEEL (Left Foot)  Patient Location: PACU  Anesthesia Type:General  Level of Consciousness: awake  Airway & Oxygen Therapy: Patient Spontanous Breathing  Post-op Assessment: Report given to RN  Post vital signs: Reviewed and stable  Last Vitals:  Vitals Value Taken Time  BP    Temp    Pulse 97 12/11/19 0905  Resp 24 12/11/19 0905  SpO2 87 % 12/11/19 0905  Vitals shown include unvalidated device data.  Last Pain:  Vitals:   12/11/19 0727  TempSrc: Oral  PainSc: 8       Patients Stated Pain Goal: 5 (02/04/22 3612)  Complications: No apparent anesthesia complications

## 2019-12-11 NOTE — Anesthesia Postprocedure Evaluation (Signed)
Anesthesia Post Note  Patient: Nakhi Choi  Procedure(s) Performed: INCISION AND DRAINAGE OF ABSCESS LEFT FOOT (HEEL) (Left Foot) APPLICATION OF WOUND VAC LEFT HEEL (Left Foot)  Patient location during evaluation: PACU Anesthesia Type: MAC Level of consciousness: awake and alert and oriented Pain management: pain level controlled Vital Signs Assessment: post-procedure vital signs reviewed and stable Respiratory status: spontaneous breathing and patient connected to nasal cannula oxygen Cardiovascular status: blood pressure returned to baseline and stable Postop Assessment: no apparent nausea or vomiting Anesthetic complications: no     Last Vitals:  Vitals:   12/11/19 0444 12/11/19 0727  BP: (!) 120/58 134/70  Pulse: 100 (!) 104  Resp: 17 19  Temp: 37.4 C 37.7 C  SpO2: 92% 93%    Last Pain:  Vitals:   12/11/19 0727  TempSrc: Oral  PainSc: 8                  Clayton Jarmon

## 2019-12-11 NOTE — Brief Op Note (Signed)
BRIEF OPERATIVE NOTE  DATE OF PROCEDURE 12/11/2019  SURGEON Marcheta Grammes, DPM  ASSISTANT SURGEON None  OR STAFF Circulator: Glory Rosebush, RN Scrub Person: Karin Lieu, CST Circulator Assistant: Connye Burkitt, RN   PREOPERATIVE DIAGNOSIS 1.  Abscess of the left foot 2.  Ulceration, left heel 3.  Diabetes mellitus with peripheral neuropathy  POSTOPERATIVE DIAGNOSIS Same  PROCEDURE 1.  Incision and drainage of abscess, left foot 2.  Application of Wound VAC, left foot  ANESTHESIA Monitor Anesthesia Care   HEMOSTASIS Pneumatic ankle tourniquet set at 250 mmHg  ESTIMATED BLOOD LOSS ~50 cc  MATERIALS USED KCI Wound VAC black sponge (1 piece)  INJECTABLES 0.5% Marcaine plain  PATHOLOGY 1.  Aerobic culture 2.  Anaerobic culture 3.  Bone from calcaneus for culture.  COMPLICATIONS None

## 2019-12-11 NOTE — Progress Notes (Signed)
Patient Demographics:    Connor Taylor, is a 69 y.o. male, DOB - [birth year], ZOX:[id number]  Admit date - 12/08/2019   Admitting Physician Edmonia Lynch, DO  Outpatient Primary MD for the patient is System, Provider Not In  LOS - 3   Chief Complaint  Patient presents with   Foot Pain        Subjective:    Connor Taylor today has no fevers, no emesis,  No chest pain,   -- -Waking up more postop  Assessment  & Plan :    Principal Problem:   Osteomyelitis of foot, left, acute ([place]) Active Problems:   Acute kidney injury superimposed on chronic kidney disease ([place])   Diabetes mellitus type 2, uncontrolled ([place])   Essential hypertension   H/O gastric bypass  Brief Summary:- 69 y.o.malewith medical history significant ofhypertension, hyperlipidemia, diabetes mellitus and chronic kidney disease patient of Dr. Kalman Shan admitted on 12/09/2019 with left foot cellulitis and osteomyelitis Status post I&D with wound and bone culture on 12/11/19     A/p 1)Osteomyelitis of the left foot/left heel/left calcaneus--  MRI of left foot consistent with osteomyelitis C/n -Flagyl 500 q8, Cefepime 2 g q24h started 6/5/21and Vancomycin  1500 q48 starting 6/7 0000 Monitor renal fx cx vanc lvls prn -CRP 25 and ESR is 120 -Discussed with podiatrist Dr. Caprice Beaver at bedside  -On 12/11/2019 patient underwent PROCEDURE 1.  Incision and drainage of abscess, left foot 2.  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--prior labs not available creatinine currently in the 3.5 range (up to 3.73)  renally adjust medications, avoid nephrotoxic agents / dehydration  / hypotension  4)HTN-stable, c/n amlodipine  5) chronic lower extremity edema--- may benefit from Ace wraps, continue torsemide  6) depression and neuropathic pain--- Cymbalta as ordered  Disposition/Need for in-Hospital Stay- patient unable to be discharged at this time due to -left foot osteomyelitis requiring IV antibiotics-may need PICC line for IV antibiotics upon DC home  Status is: Inpatient  Remains inpatient appropriate because:IV treatments appropriate due to intensity of illness or inability to take PO   Dispo: The patient is from: Home              Anticipated d/c is to: Home              Anticipated d/c date is: 2 days              Patient currently is not medically stable to d/c. Barriers: Not Clinically Stable- -left foot osteomyelitis requiring IV antibiotics-may need PICC line for IV antibiotics upon DC home  Code Status : full  Family Communication:   NA (patient is alert, awake and coherent)  Consults  :  Podiatry  -On 12/11/2019 patient underwent PROCEDURE 1.  Incision and drainage of abscess, left foot 2.  Application of Wound VAC, left foot 3.-Aerobic and anaerobic wound  culture as well as calcaneal bone culture   DVT Prophylaxis  :    - Heparin - SCDs   Lab Results  Component Value Date   PLT 415 (H) 12/11/2019    Inpatient Medications  Scheduled Meds:  amLODipine  5 mg Oral q morning - 10a   atorvastatin  20 mg Oral Daily   DULoxetine  60 mg Oral Daily   insulin aspart  0-5 Units Subcutaneous QHS   insulin aspart  0-6 Units Subcutaneous TID WC   pantoprazole  40 mg Oral BID   torsemide  40 mg Oral BID   Continuous Infusions:  sodium chloride Stopped (12/11/19 0444)   ceFEPime (MAXIPIME) IV Stopped (12/10/19 1912)   metronidazole 100 mL/hr at 12/11/19  0500   vancomycin Stopped (12/11/19 0242)   PRN Meds:.acetaminophen **OR** acetaminophen, HYDROcodone-acetaminophen, morphine injection, ondansetron **OR** ondansetron (ZOFRAN) IV    Anti-infectives (From admission, onward)   Start     Dose/Rate Route Frequency Ordered Stop   12/11/19 0000  vancomycin (VANCOREADY) IVPB 1500 mg/300 mL     1,500 mg 150 mL/hr over 120 Minutes Intravenous Every 48 hours 12/09/19 0434     12/09/19 1800  ceFEPIme (MAXIPIME) 2 g in sodium chloride 0.9 % 100 mL IVPB     2 g 200 mL/hr over 30 Minutes Intravenous Every 24 hours 12/09/19 0434     12/09/19 0445  vancomycin (VANCOREADY) IVPB 500 mg/100 mL     500 mg 100 mL/hr over 60 Minutes Intravenous  Once 12/09/19 0434 12/09/19 0651   12/09/19 0430  metroNIDAZOLE (FLAGYL) IVPB 500 mg    Note to Pharmacy: Already got first dose in the ED.  Kindly adjust the timing for next dose   500 mg 100 mL/hr over 60 Minutes Intravenous Every 8 hours 12/09/19 0419     12/08/19 1930  vancomycin (VANCOCIN) IVPB 1000 mg/200 mL premix     1,000 mg 200 mL/hr over 60 Minutes Intravenous  Once 12/08/19 1915 12/08/19 2128   12/08/19 1930  metroNIDAZOLE (FLAGYL) IVPB 500 mg     500 mg 100 mL/hr over 60 Minutes Intravenous  Once 12/08/19 1915 12/08/19 2127   12/08/19 1930  ceFEPIme (MAXIPIME) 2 g in sodium chloride 0.9 % 100 mL IVPB     2 g 200 mL/hr over 30 Minutes Intravenous  Once 12/08/19 1916 12/08/19 2038        Objective:   Vitals:   12/11/19 0928 12/11/19 0930 12/11/19 0945 12/11/19 1009  BP: 117/66   128/61  Pulse: 91 96  98  Resp: (!) 24 (!) 22  18  Temp:   98.3 F (36.8 C) 98.3 F (36.8 C)  TempSrc:      SpO2: 95% 95%  95%  Weight:      Height:        Wt Readings from Last 3 Encounters:  12/08/19 112 kg     Intake/Output Summary (Last 24 hours) at 12/11/2019 1145 Last data filed at 12/11/2019 0102 Gross per 24 hour  Intake 1619.66 ml  Output 3695 ml  Net -2075.34 ml     Physical Exam  Gen:-  Awake Alert,  In no apparent distress  HEENT:- [place].AT, No sclera icterus Neck-Supple Neck,No JVD,.  Lungs-  CTAB , fair symmetrical air movement CV- S1, S2 normal, regular  Abd-  +ve B.Sounds, Abd Soft, No tenderness,    Extremity/Skin:- +ve  edema, pedal pulses present , - Lt foot -status post I&D with wound VAC in situ psych-affect  is appropriate, oriented x3 Neuro-no new focal deficits, no tremors   Data Review:   Micro Results Recent Results (from the past 240 hour(s))  Culture, blood (Routine X 2) w Reflex to ID Panel     Status: None (Preliminary result)   Collection Time: 12/08/19  5:55 PM   Specimen: Left Antecubital; Blood  Result Value Ref Range Status   Specimen Description LEFT ANTECUBITAL  Final   Special Requests   Final    BOTTLES DRAWN AEROBIC AND ANAEROBIC Blood Culture adequate volume   Culture   Final    NO GROWTH 3 DAYS Performed at [facility], [address], [place], [place] [address]    Report Status PENDING  Incomplete  Culture, blood (Routine X 2) w Reflex to ID Panel     Status: None (Preliminary result)   Collection Time: 12/08/19  5:55 PM   Specimen: Right Antecubital; Blood  Result Value Ref Range Status   Specimen Description RIGHT ANTECUBITAL  Final   Special Requests   Final    BOTTLES DRAWN AEROBIC AND ANAEROBIC Blood Culture adequate volume   Culture   Final    NO GROWTH 3 DAYS Performed at [facility], [address] [place], [place] [address]    Report Status PENDING  Incomplete  SARS Coronavirus 2 by RT PCR (hospital order, performed in [place] hospital lab) Nasopharyngeal Nasopharyngeal Swab     Status: None   Collection Time: 12/08/19  7:09 PM   Specimen: Nasopharyngeal Swab  Result Value Ref Range Status   SARS Coronavirus 2 NEGATIVE NEGATIVE Final    Comment: (NOTE) SARS-CoV-2 target nucleic acids are NOT DETECTED. The SARS-CoV-2 RNA is generally detectable in upper and lower respiratory specimens during the acute phase of  infection. The lowest concentration of SARS-CoV-2 viral copies this assay can detect is 250 copies / mL. A negative result does not preclude SARS-CoV-2 infection and should not be used as the sole basis for treatment or other patient management decisions.  A negative result may occur with improper specimen collection / handling, submission of specimen other than nasopharyngeal swab, presence of viral mutation(s) within the areas targeted by this assay, and inadequate number of viral copies (<250 copies / mL). A negative result must be combined with clinical observations, patient history, and epidemiological information. Fact Sheet for Patients:   StrictlyIdeas.no Fact Sheet for Healthcare Providers: BankingDealers.co.za This test is not yet approved or cleared  by the [place] FDA and has been authorized for detection and/or diagnosis of SARS-CoV-2 by FDA under an Emergency Use Authorization (EUA).  This EUA will remain in effect (meaning this test can be used) for the duration of the COVID-19 declaration under Section 564(b)(1) of the Act, 21 U.S.C. section 360bbb-3(b)(1), unless the authorization is terminated or revoked sooner. Performed at [facility], [address], [place], [place] [address]   Surgical pcr screen     Status: None   Collection Time: 12/10/19  8:44 PM   Specimen: Nasal Mucosa; Nasal Swab  Result Value Ref Range Status   MRSA, PCR NEGATIVE NEGATIVE Final   Staphylococcus aureus NEGATIVE NEGATIVE Final    Comment: (NOTE) The Xpert SA Assay (FDA approved for NASAL specimens in patients 83 years of age and older), is one component of a comprehensive surveillance program. It is not intended to diagnose infection nor to guide or monitor treatment. Performed at [facility], [address], [place], [place] [address]     Radiology Reports MR FOOT LEFT  WO CONTRAST  Result Date: 12/08/2019 CLINICAL DATA:  Diabetic  foot swelling, possible osteomyelitis EXAM: MRI OF THE LEFT FOOT WITHOUT CONTRAST TECHNIQUE: Multiplanar, multisequence MR imaging of the ankle was performed. No intravenous contrast was administered. COMPARISON:  None. FINDINGS: TENDONS Peroneal: Mild common peroneus tenosynovitis. Posteromedial: Distal tibialis posterior tenosynovitis and mild distal tibialis posterior tendinopathy. Mild flexor hallucis longus tenosynovitis just proximal to the knot of Henry. Anterior: Unremarkable Achilles: Distal Achilles tendinopathy with expansion of the distal Achilles tendon and multiple spurs and ossicles in the calcaneus along the distal Achilles attachment site. Plantar Fascia: There is discontinuity in both the medial and lateral bands of the plantar fascia proximally due to a large fluid collection which extends into the calcaneus and drains down to the plantar surface of the foot. Appearance is compatible with partial rupture. LIGAMENTS Lateral: Attenuated distal attachment of the anterior talofibular ligament, query remote injury. Thickened calcaneofibular ligament. Medial: Thickened tibiospring and tibionavicular components of the deltoid ligament. CARTILAGE Ankle Joint: 0.8 by 1.1 by 0.6 cm non-fragmented osteochondral lesion of the lateral talar dome, image 18/5. Mild degenerative chondral thinning. Subtalar Joints/Sinus Tarsi: Small effusion of the posterior subtalar facet. Spurring of the subtalar joint. Bones: Abnormal fluid collection in the calcaneus measuring 3.5 by 2.4 by 2.2 cm, likely chronic, with surrounding mild marrow edema in the calcaneus. This fluid collection is continuous with a soft tissue fluid collection extending in region of the mostly ruptured plantar fascia. There is a draining sinus tract to the plantar heel on image 10/6. Degenerative and possibly erosive findings in the midfoot and Lisfranc joint but no other regions compelling for active osteomyelitis. Other: Diffuse striking  subcutaneous edema in the ankle, possibly from cellulitis. Diffuse muscular edema, especially along the plantar hindfoot and midfoot, which could be neurogenic edema or myositis. IMPRESSION: 1. 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 2. Diffuse striking subcutaneous edema in the ankle, possibly from cellulitis. 3. Diffuse muscular edema, especially along the plantar hindfoot and midfoot, which could be neurogenic edema or myositis. 4. Distal Achilles tendinopathy with expansion of the distal Achilles tendon and multiple spurs and ossicles along the distal Achilles attachment site. 5. Distal tibialis posterior tenosynovitis and mild distal tibialis posterior tendinopathy. 6. Mild flexor hallucis longus tenosynovitis just proximal to the knot of Henry. 7. Mild common peroneus tenosynovitis. 8. Attenuated distal attachment of the anterior talofibular ligament, query remote injury. 9. Thickened tibiospring and tibionavicular components of the deltoid ligament, query remote injury. 10. 0.8 by 1.1 cm non-fragmented osteochondral lesion of the lateral talar dome. 11. Degenerative and possibly erosive findings in the midfoot and Lisfranc joint but no other regions compelling for active osteomyelitis. Electronically Signed   By: Van Clines M.D.   On: 12/08/2019 18:30     CBC Recent Labs  Lab 12/08/19 1754 12/09/19 0614 12/11/19 0553  WBC 17.9* 13.0* 12.3*  HGB 10.0* 8.4* 9.1*  HCT 33.5* 28.1* 29.7*  PLT 467* 402* 415*  MCV 94.6 92.4 92.2  MCH 28.2 27.6 28.3  MCHC 29.9* 29.9* 30.6  RDW 15.9* 15.6* 15.9*  LYMPHSABS 0.4*  --   --   MONOABS 0.9  --   --   EOSABS 0.0  --   --   BASOSABS 0.0  --   --     Chemistries  Recent Labs  Lab 12/08/19 1754 12/09/19 0614 12/10/19 0445 12/11/19 0553  NA  138 140 139 138  K 3.8 3.4* 4.2 4.0  CL 103 106 108 105  CO2 20* 22 21* 22  GLUCOSE 196* 105* 116* 135*  BUN 56* 55* 56* 59*  CREATININE 3.55* 3.50* 3.55* 3.73*  CALCIUM 8.3* 7.9* 7.7* 7.9*  AST 19 20  --   --   ALT 14 12  --   --   ALKPHOS 102 77  --   --   BILITOT 0.6 0.8  --   --    ------------------------------------------------------------------------------------------------------------------ No results for input(s): CHOL, HDL, LDLCALC, TRIG, CHOLHDL, LDLDIRECT in the last 72 hours.  Lab Results  Component Value Date   HGBA1C 7.2 (H) 12/08/2019   ------------------------------------------------------------------------------------------------------------------ No results for input(s): TSH, T4TOTAL, T3FREE, THYROIDAB in the last 72 hours.  Invalid input(s): FREET3 ------------------------------------------------------------------------------------------------------------------ No results for input(s): VITAMINB12, FOLATE, FERRITIN, TIBC, IRON, RETICCTPCT in the last 72 hours.  Coagulation profile No results for input(s): INR, PROTIME in the last 168 hours.  No results for input(s): DDIMER in the last 72 hours.  Cardiac Enzymes No results for input(s): CKMB, TROPONINI, MYOGLOBIN in the last 168 hours.  Invalid input(s): CK ------------------------------------------------------------------------------------------------------------------ No results found for: BNP   Roxan Hockey M.D on 12/11/2019 at 11:45 AM  Go to [email or web address] - for contact info  [facility] - Office  [phone]

## 2019-12-11 NOTE — Plan of Care (Signed)

## 2019-12-11 NOTE — Progress Notes (Signed)
PODIATRY PROGRESS NOTE  SUBJECTIVE The patient was seen in pre-op area.  C/O pain in L heel.    MEDICATIONS Scheduled Meds: . [MAR Hold] amLODipine  5 mg Oral q morning - 10a  . [MAR Hold] atorvastatin  20 mg Oral Daily  . [MAR Hold] DULoxetine  60 mg Oral Daily  . [MAR Hold] insulin aspart  0-5 Units Subcutaneous QHS  . [MAR Hold] insulin aspart  0-6 Units Subcutaneous TID WC  . ipratropium-albuterol  3 mL Nebulization Q4H  . [MAR Hold] pantoprazole  40 mg Oral BID  . [MAR Hold] torsemide  40 mg Oral BID   Continuous Infusions: . sodium chloride Stopped (12/11/19 0444)  . [MAR Hold] ceFEPime (MAXIPIME) IV Stopped (12/10/19 1912)  . [MAR Hold] metronidazole 100 mL/hr at 12/11/19 0500  . [MAR Hold] vancomycin Stopped (12/11/19 0242)   PRN Meds:.[MAR Hold] acetaminophen **OR** [MAR Hold] acetaminophen, [MAR Hold] HYDROcodone-acetaminophen, [MAR Hold]  morphine injection, [MAR Hold] ondansetron **OR** [MAR Hold] ondansetron (ZOFRAN) IV  OBJECTIVE Vital signs in last 24 hours:   Temp:  [98.1 F (36.7 C)-99.9 F (37.7 C)] 99.9 F (37.7 C) (06/07 0727) Pulse Rate:  [81-104] 104 (06/07 0727) Resp:  [17-20] 19 (06/07 0727) BP: (119-142)/(58-71) 134/70 (06/07 0727) SpO2:  [84 %-93 %] 93 % (06/07 0727)  DRESSING:  Clean, dry and intact.  Dressing was not removed.  LAB/TEST RESULTS  Recent Labs    12/09/19 0614 12/09/19 0614 12/10/19 0445 12/11/19 0553  WBC 13.0*  --   --  12.3*  HGB 8.4*  --   --  9.1*  HCT 28.1*  --   --  29.7*  PLT 402*  --   --  415*  NA 140   < > 139 138  K 3.4*   < > 4.2 4.0  CL 106   < > 108 105  CO2 22   < > 21* 22  BUN 55*   < > 56* 59*  CREATININE 3.50*   < > 3.55* 3.73*  GLUCOSE 105*   < > 116* 135*  CALCIUM 7.9*   < > 7.7* 7.9*   < > = values in this interval not displayed.    Recent Results (from the past 240 hour(s))  Culture, blood (Routine X 2) w Reflex to ID Panel     Status: None (Preliminary result)   Collection Time: 12/08/19   5:55 PM   Specimen: Left Antecubital; Blood  Result Value Ref Range Status   Specimen Description LEFT ANTECUBITAL  Final   Special Requests   Final    BOTTLES DRAWN AEROBIC AND ANAEROBIC Blood Culture adequate volume   Culture   Final    NO GROWTH 3 DAYS Performed at [facility], [address] [place], [place] [address]    Report Status PENDING  Incomplete  Culture, blood (Routine X 2) w Reflex to ID Panel     Status: None (Preliminary result)   Collection Time: 12/08/19  5:55 PM   Specimen: Right Antecubital; Blood  Result Value Ref Range Status   Specimen Description RIGHT ANTECUBITAL  Final   Special Requests   Final    BOTTLES DRAWN AEROBIC AND ANAEROBIC Blood Culture adequate volume   Culture   Final    NO GROWTH 3 DAYS Performed at [facility], [address], [place], [place] [address]    Report Status PENDING  Incomplete  SARS Coronavirus 2 by RT PCR (hospital order, performed in [place] hospital lab) Nasopharyngeal Nasopharyngeal  Swab     Status: None   Collection Time: 12/08/19  7:09 PM   Specimen: Nasopharyngeal Swab  Result Value Ref Range Status   SARS Coronavirus 2 NEGATIVE NEGATIVE Final    Comment: (NOTE) SARS-CoV-2 target nucleic acids are NOT DETECTED. The SARS-CoV-2 RNA is generally detectable in upper and lower respiratory specimens during the acute phase of infection. The lowest concentration of SARS-CoV-2 viral copies this assay can detect is 250 copies / mL. A negative result does not preclude SARS-CoV-2 infection and should not be used as the sole basis for treatment or other patient management decisions.  A negative result may occur with improper specimen collection / handling, submission of specimen other than nasopharyngeal swab, presence of viral mutation(s) within the areas targeted by this assay, and inadequate number of viral copies (<250 copies / mL). A negative result must be combined with clinical observations, patient history, and  epidemiological information. Fact Sheet for Patients:   StrictlyIdeas.no Fact Sheet for Healthcare Providers: BankingDealers.co.za This test is not yet approved or cleared  by the [place] FDA and has been authorized for detection and/or diagnosis of SARS-CoV-2 by FDA under an Emergency Use Authorization (EUA).  This EUA will remain in effect (meaning this test can be used) for the duration of the COVID-19 declaration under Section 564(b)(1) of the Act, 21 U.S.C. section 360bbb-3(b)(1), unless the authorization is terminated or revoked sooner. Performed at [facility], [address] [place], [place] [address]   Surgical pcr screen     Status: None   Collection Time: 12/10/19  8:44 PM   Specimen: Nasal Mucosa; Nasal Swab  Result Value Ref Range Status   MRSA, PCR NEGATIVE NEGATIVE Final   Staphylococcus aureus NEGATIVE NEGATIVE Final    Comment: (NOTE) The Xpert SA Assay (FDA approved for NASAL specimens in patients 71 years of age and older), is one component of a comprehensive surveillance program. It is not intended to diagnose infection nor to guide or monitor treatment. Performed at [facility], [address], [place], [place] [address]      No results found.  ASSESSMENT 1.Abscess, leftheel. 2. Chronic osteomyelitis, left calcaneus. 3. Diabetes mellitus with peripheral neuropathy. 4. Chronic kidney disease.  PLAN Proceed with planned procedure.  NPO since midnight.  Signed consent on chart.    Connor Taylor 12/11/2019, 7:58 AM

## 2019-12-11 NOTE — Progress Notes (Signed)
Pt oxygen on room air 84%. Pt admits to wearing 2 L [place] [facility].   2L applied via nasal cannula. Oxygen saturation increased to 90%

## 2019-12-11 NOTE — TOC Initial Note (Addendum)
Transition of Care [facility]) - Initial/Assessment Note    Patient Details  Name: Connor Taylor MRN: [id number] Date of Birth: [birth year]  [facility] CM/SW Contact:    Boneta Lucks, RN Phone Number: 12/11/2019, 2:56 PM  Clinical Narrative:        Patient admitted with osteomyelitis.  Patient has surgery today. Wound vac applied. TOC to follow for other discharge needs.  [facility] can not accept the referral for [place] patient.       Barriers to Discharge: Continued Medical Work up    Expected Discharge Plan and Services               DME Arranged: Vac(wound vac) DME Agency: [facility] Date DME Agency Contacted: 12/11/19 Time DME Agency Contacted: [phone] Representative spoke with at DME Agency: Blake Divine      Prior Living Arrangements/Services     Activities of Daily Living Home Assistive Devices/Equipment: CBG Meter ADL Screening (condition at time of admission) Patient's cognitive ability adequate to safely complete daily activities?: Yes Is the patient deaf or have difficulty hearing?: No Does the patient have difficulty seeing, even when wearing glasses/contacts?: No Does the patient have difficulty concentrating, remembering, or making decisions?: No Patient able to express need for assistance with ADLs?: Yes Does the patient have difficulty dressing or bathing?: No Independently performs ADLs?: Yes (appropriate for developmental age) Does the patient have difficulty walking or climbing stairs?: Yes Weakness of Legs: Left Weakness of Arms/Hands: None    Admission diagnosis:  Osteomyelitis of foot, left, acute ([place]) [M86.172] Osteomyelitis of left foot, unspecified type [facility]) [M86.9] Patient Active Problem List   Diagnosis Date Noted  . Diabetes mellitus type 2, uncontrolled ([place]) 12/10/2019  . H/O gastric bypass 12/10/2019  . Hyperglycemia due to diabetes mellitus ([place]) 12/09/2019  . Acute kidney injury superimposed on chronic kidney disease  ([place]) 12/09/2019  . Essential hypertension 12/09/2019  . Osteomyelitis of foot, left, acute ([place]) 12/08/2019   PCP:  System, Provider Not In Pharmacy:  No Pharmacies Listed

## 2019-12-11 NOTE — Op Note (Signed)
OPERATIVE NOTE  DATE OF PROCEDURE 12/11/2019  SURGEON Marcheta Grammes, DPM  ASSISTANT SURGEON None  OR STAFF Circulator: Glory Rosebush, RN Scrub Person: Karin Lieu, CST Circulator Assistant: Connye Burkitt, RN   PREOPERATIVE DIAGNOSIS 1.  Abscess of the left foot 2.  Ulceration, left heel 3.  Diabetes mellitus with peripheral neuropathy  POSTOPERATIVE DIAGNOSIS Same  PROCEDURE 1.  Incision and drainage of abscess, left foot 2.  Application of Wound VAC, left foot  ANESTHESIA Monitor Anesthesia Care   HEMOSTASIS Pneumatic ankle tourniquet set at 250 mmHg  ESTIMATED BLOOD LOSS ~50 cc  MATERIALS USED KCI Wound VAC black sponge (1 piece)  INJECTABLES 0.5% Marcaine plain  PATHOLOGY 1.  Aerobic culture 2.  Anaerobic culture 3.  Bone from calcaneus for culture.  COMPLICATIONS None  INDICATIONS:  The patient was admitted on 12/08/2019 for infected ulcer of his left foot with concern for underlying abscess.  MRI was performed revealing an abscess of the left heel with changes consistent with osteomyelitis of the left calcaneus.  An incision and drainage of the abscess with possible wound VAC placement was recommended.  The benefits and risk of the procedure were explained.  The patient agreed with the course of care and a signed consent was obtained.  DESCRIPTION OF THE PROCEDURE:  The patient was brought to the operating room and placed on the operative table in the supine position.  A pneumatic ankle tourniquet was applied to the left ankle but was never inflated.  A timeout was performed.  Following sedation the surgical site was anesthetized with 0.5% Marcaine plain.  The foot was then prepped, scrubbed and draped in usual sterile technique.  A second timeout was performed.  Attention was directed to the plantar aspect of the left heel.  An ulceration measuring 0.5 x 0.4 x 0.8 cm was noted.  The ulceration did probe to bone.  Using a #15 blade.  2 converging  some optical incisions were made excising the ulceration in total.  These incision was extended proximally and medially.  The soft tissue was bluntly dissected using a hemostat.  Approximately 10 cc of purulence was encountered.  The ulceration required debridement of the adherent, nonviable fibrotic tissue.  A sharp excisional debridement of the ulceration was performed using a #15 blade and soft tissue nipper.  [Adherent, nonviable fibrotic tissue] including subcutaneous tissue was debrided and removed.  Following incision and drainage including debridement of all nonviable tissue, the surgical wound measure 3.6 x 3.1 x 1.1 cm.  Aerobic and anaerobic cultures were obtained.  The plantar surface of the cuboid was debrided using a bone rongeur.  A sample of the bone was sent to microbiology for culture.  A Wound VAC dressing was applied.  The Wound VAC was found to be operational at 125 mmHg continuous pressure.  A Kerlix and ACE wrap were applied over the wound VAC dressing.  The patient tolerated the procedure well.  The patient was transferred from the operating room to the PACU with vital signs stable and vascular status intact to all digits of the left foot.

## 2019-12-12 ENCOUNTER — Inpatient Hospital Stay: Payer: Self-pay

## 2019-12-12 LAB — GLUCOSE, CAPILLARY
Glucose-Capillary: 111 mg/dL — ABNORMAL HIGH (ref 70–99)
Glucose-Capillary: 183 mg/dL — ABNORMAL HIGH (ref 70–99)
Glucose-Capillary: 127 mg/dL — ABNORMAL HIGH (ref 70–99)
Glucose-Capillary: 152 mg/dL — ABNORMAL HIGH (ref 70–99)

## 2019-12-12 MED ORDER — SODIUM CHLORIDE 0.9% FLUSH
10.0000 mL | Freq: Two times a day (BID) | INTRAVENOUS | Status: DC
  Administered 2019-12-12: 10 mL

## 2019-12-12 MED ORDER — BISACODYL 10 MG RE SUPP
10.0000 mg | Freq: Once | RECTAL | Status: AC
  Administered 2019-12-12: 10 mg via RECTAL
  Filled 2019-12-12: qty 1

## 2019-12-12 MED ORDER — SODIUM CHLORIDE 0.9% FLUSH: 10.0000 mL | INTRAVENOUS | Status: DC | PRN

## 2019-12-12 MED ORDER — CHLORHEXIDINE GLUCONATE CLOTH 2 % EX PADS
6.0000 | MEDICATED_PAD | Freq: Every day | CUTANEOUS | Status: DC
  Administered 2019-12-12 – 2019-12-13 (×2): 6 via TOPICAL

## 2019-12-12 MED ORDER — SENNOSIDES-DOCUSATE SODIUM 8.6-50 MG PO TABS
2.0000 | ORAL_TABLET | Freq: Two times a day (BID) | ORAL | Status: DC
  Administered 2019-12-12 – 2019-12-13 (×3): 2 via ORAL
  Filled 2019-12-12 (×3): qty 2

## 2019-12-12 NOTE — Progress Notes (Signed)
Patient Demographics:    Connor Taylor, is a 69 y.o. male, DOB - 1950/07/16, QDI:[id number]  Admit date - 12/08/2019   Admitting Physician Edmonia Lynch, DO  Outpatient Primary MD for the patient is Kennieth Rad, MD  LOS - 4   Chief Complaint  Patient presents with  . Foot Pain        Subjective:    Connor Taylor today has no fevers, no emesis,  No chest pain,   -- -Eating and drinking okay, no BM in over 5 days  Assessment  & Plan :    Principal Problem:   Osteomyelitis of foot, left, acute ([place]) Active Problems:   Acute kidney injury superimposed on chronic kidney disease (HCC)   Diabetes mellitus type 2, uncontrolled ([place])   Essential hypertension   H/O gastric bypass  Brief Summary:- 69 y.o.malewith medical history significant ofhypertension, hyperlipidemia, diabetes mellitus and chronic kidney disease patient of Dr. Kalman Shan admitted on 12/09/2019 with left foot cellulitis and osteomyelitis Status post I&D with wound and bone culture on 12/11/19     A/p 1)Osteomyelitis of the left foot/left heel/left calcaneus--  MRI of left foot consistent with osteomyelitis C/n -Flagyl 500 q8, Cefepime 2 g q24h started 6/5/21and Vancomycin  1500 q48 starting 6/7 0000 Monitor renal fx cx vanc lvls prn -CRP 25 and ESR is 120 -Discussed with podiatrist Dr. Caprice Beaver  -Plan will be to discharge home with home health RN  for [facility] changes.    Dr. Caprice Beaver and the podiatrist will change the [facility] dressing each Monday in his [place] office.  Home health RN will need to change it on Wednesday and Fridays.  He will need PICC line placement for 6 to 8 weeks of IV antibiotics.    Antibiotic selection can be adjusted based on the surgical culture results.    -On 12/11/2019 patient underwent PROCEDURE 1.  Incision and drainage of abscess, left foot 2.  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--prior labs not available creatinine currently in the 3.5 range (up to 3.73)  renally adjust medications, avoid nephrotoxic agents / dehydration  / hypotension  4)HTN-stable, c/n amlodipine  5) chronic lower extremity edema--- may benefit from Ace wraps, continue torsemide  6) depression and neuropathic pain--- Cymbalta as ordered  Disposition/Need for in-Hospital Stay- patient unable to be discharged at this time due to -left foot osteomyelitis requiring IV antibiotics-  needs PICC line for IV antibiotics upon DC home  Status is: Inpatient  Remains inpatient appropriate because:IV treatments appropriate due to intensity of illness or inability to take PO   Dispo: The patient is from: Home              Anticipated d/c is to: Home              Anticipated d/c date is: 2 days  Patient currently is not medically stable to d/c. Barriers: Not Clinically Stable- -left foot osteomyelitis requiring IV antibiotics-  Needs PICC line for IV antibiotics upon DC home  Code Status : full  Family Communication:   NA (patient is alert, awake and coherent)  Consults  :  Podiatry  -On 12/11/2019 patient underwent PROCEDURE 1.  Incision and drainage of abscess, left foot 2.  Application of Wound VAC, left foot 3.-Aerobic and anaerobic wound culture as well as calcaneal bone culture   DVT Prophylaxis  :    - Heparin - SCDs   Lab Results  Component Value Date   PLT 415 (H) 12/11/2019    Inpatient Medications  Scheduled Meds: . amLODipine  5 mg Oral q morning  - 10a  . atorvastatin  20 mg Oral Daily  . bisacodyl  10 mg Rectal Once  . DULoxetine  60 mg Oral Daily  . insulin aspart  0-5 Units Subcutaneous QHS  . insulin aspart  0-6 Units Subcutaneous TID WC  . pantoprazole  40 mg Oral BID  . senna-docusate  2 tablet Oral BID  . torsemide  40 mg Oral BID   Continuous Infusions: . sodium chloride Stopped (12/11/19 0634)  . ceFEPime (MAXIPIME) IV 2 g (12/11/19 1842)  . metronidazole 500 mg (12/12/19 1259)  . vancomycin Stopped (12/11/19 0242)   PRN Meds:.acetaminophen **OR** acetaminophen, HYDROcodone-acetaminophen, morphine injection, ondansetron **OR** ondansetron (ZOFRAN) IV    Anti-infectives (From admission, onward)   Start     Dose/Rate Route Frequency Ordered Stop   12/11/19 0000  vancomycin (VANCOREADY) IVPB 1500 mg/300 mL     1,500 mg 150 mL/hr over 120 Minutes Intravenous Every 48 hours 12/09/19 0434     12/09/19 1800  ceFEPIme (MAXIPIME) 2 g in sodium chloride 0.9 % 100 mL IVPB     2 g 200 mL/hr over 30 Minutes Intravenous Every 24 hours 12/09/19 0434     12/09/19 0445  vancomycin (VANCOREADY) IVPB 500 mg/100 mL     500 mg 100 mL/hr over 60 Minutes Intravenous  Once 12/09/19 0434 12/09/19 0651   12/09/19 0430  metroNIDAZOLE (FLAGYL) IVPB 500 mg    Note to Pharmacy: Already got first dose in the ED.  Kindly adjust the timing for next dose   500 mg 100 mL/hr over 60 Minutes Intravenous Every 8 hours 12/09/19 0419     12/08/19 1930  vancomycin (VANCOCIN) IVPB 1000 mg/200 mL premix     1,000 mg 200 mL/hr over 60 Minutes Intravenous  Once 12/08/19 1915 12/08/19 2128   12/08/19 1930  metroNIDAZOLE (FLAGYL) IVPB 500 mg     500 mg 100 mL/hr over 60 Minutes Intravenous  Once 12/08/19 1915 12/08/19 2127   12/08/19 1930  ceFEPIme (MAXIPIME) 2 g in sodium chloride 0.9 % 100 mL IVPB     2 g 200 mL/hr over 30 Minutes Intravenous  Once 12/08/19 1916 12/08/19 2038        Objective:   Vitals:   12/11/19 1009 12/11/19 1449 12/11/19  2039 12/12/19 0414  BP: 128/61 129/62 115/61 114/65  Pulse: 98 88 (!) 106 97  Resp: 18 18 20 20   Temp: 98.3 F (36.8 C) 97.7 F (36.5 C) 100.3 F (37.9 C) 98.7 F (37.1 C)  TempSrc: Oral Oral Oral Oral  SpO2: 95% 95% 93% (!) 87%  Weight:      Height:        Wt Readings from Last 3 Encounters:  12/08/19 112 kg  Intake/Output Summary (Last 24 hours) at 12/12/2019 1626 Last data filed at 12/12/2019 0800 Gross per 24 hour  Intake 480 ml  Output 4800 ml  Net -4320 ml     Physical Exam  Gen:- Awake Alert,  In no apparent distress  HEENT:- [place].AT, No sclera icterus Neck-Supple Neck,No JVD,.  Lungs-  CTAB , fair symmetrical air movement CV- S1, S2 normal, regular  Abd-  +ve B.Sounds, Abd Soft, No tenderness,    Extremity/Skin:- +ve  edema, pedal pulses present , - Lt foot -status post I&D with wound VAC in situ psych-affect is appropriate, oriented x3 Neuro-no new focal deficits, no tremors   Data Review:   Micro Results Recent Results (from the past 240 hour(s))  Culture, blood (Routine X 2) w Reflex to ID Panel     Status: None (Preliminary result)   Collection Time: 12/08/19  5:55 PM   Specimen: Left Antecubital; Blood  Result Value Ref Range Status   Specimen Description LEFT ANTECUBITAL  Final   Special Requests   Final    BOTTLES DRAWN AEROBIC AND ANAEROBIC Blood Culture adequate volume   Culture   Final    NO GROWTH 4 DAYS Performed at [facility], [address] [place], [place] [address]    Report Status PENDING  Incomplete  Culture, blood (Routine X 2) w Reflex to ID Panel     Status: None (Preliminary result)   Collection Time: 12/08/19  5:55 PM   Specimen: Right Antecubital; Blood  Result Value Ref Range Status   Specimen Description RIGHT ANTECUBITAL  Final   Special Requests   Final    BOTTLES DRAWN AEROBIC AND ANAEROBIC Blood Culture adequate volume   Culture   Final    NO GROWTH 4 DAYS Performed at [facility], [address], [place],  [place] [address]    Report Status PENDING  Incomplete  SARS Coronavirus 2 by RT PCR (hospital order, performed in [place] hospital lab) Nasopharyngeal Nasopharyngeal Swab     Status: None   Collection Time: 12/08/19  7:09 PM   Specimen: Nasopharyngeal Swab  Result Value Ref Range Status   SARS Coronavirus 2 NEGATIVE NEGATIVE Final    Comment: (NOTE) SARS-CoV-2 target nucleic acids are NOT DETECTED. The SARS-CoV-2 RNA is generally detectable in upper and lower respiratory specimens during the acute phase of infection. The lowest concentration of SARS-CoV-2 viral copies this assay can detect is 250 copies / mL. A negative result does not preclude SARS-CoV-2 infection and should not be used as the sole basis for treatment or other patient management decisions.  A negative result may occur with improper specimen collection / handling, submission of specimen other than nasopharyngeal swab, presence of viral mutation(s) within the areas targeted by this assay, and inadequate number of viral copies (<250 copies / mL). A negative result must be combined with clinical observations, patient history, and epidemiological information. Fact Sheet for Patients:   StrictlyIdeas.no Fact Sheet for Healthcare Providers: BankingDealers.co.za This test is not yet approved or cleared  by the [place] FDA and has been authorized for detection and/or diagnosis of SARS-CoV-2 by FDA under an Emergency Use Authorization (EUA).  This EUA will remain in effect (meaning this test can be used) for the duration of the COVID-19 declaration under Section 564(b)(1) of the Act, 21 U.S.C. section 360bbb-3(b)(1), unless the authorization is terminated or revoked sooner. Performed at [facility], [address] [place], [place] [address]   Surgical pcr screen  Status: None   Collection Time: 12/10/19  8:44 PM   Specimen: Nasal Mucosa; Nasal Swab  Result Value Ref  Range Status   MRSA, PCR NEGATIVE NEGATIVE Final   Staphylococcus aureus NEGATIVE NEGATIVE Final    Comment: (NOTE) The Xpert SA Assay (FDA approved for NASAL specimens in patients 29 years of age and older), is one component of a comprehensive surveillance program. It is not intended to diagnose infection nor to guide or monitor treatment. Performed at [facility], [address] [place], [place] [address]   Aerobic/Anaerobic Culture (surgical/deep wound)     Status: None (Preliminary result)   Collection Time: 12/11/19  8:34 AM   Specimen: Abscess  Result Value Ref Range Status   Specimen Description   Final    ABSCESS FOOT LEFT Performed at [facility], [address] [place], [place] [address]    Special Requests   Final    NONE Performed at [facility], [address] [place], [place] [address]    Gram Stain   Final    FEW WBC PRESENT,BOTH PMN AND MONONUCLEAR NO ORGANISMS SEEN    Culture   Final    CULTURE REINCUBATED FOR BETTER GROWTH Performed at [facility], [place] [address], [place], [place] [address]    Report Status PENDING  Incomplete  Aerobic/Anaerobic Culture (surgical/deep wound)     Status: None (Preliminary result)   Collection Time: 12/11/19  8:40 AM   Specimen: Other Source; Tissue  Result Value Ref Range Status   Specimen Description   Final    TISSUE LEFT CALCANEUS Performed at [facility], [address] [place], [place] [address]    Special Requests   Final    NONE Performed at [facility], [address], [place], [place] [address]    Gram Stain   Final    FEW WBC PRESENT,BOTH PMN AND MONONUCLEAR NO ORGANISMS SEEN    Culture   Final    CULTURE REINCUBATED FOR BETTER GROWTH Performed at [facility], [place] [address], [place],  [address]    Report Status PENDING  Incomplete    Radiology Reports MR FOOT LEFT WO CONTRAST  Result Date: 12/08/2019 CLINICAL DATA:  Diabetic foot swelling, possible osteomyelitis EXAM: MRI OF  THE LEFT FOOT WITHOUT CONTRAST TECHNIQUE: Multiplanar, multisequence MR imaging of the ankle was performed. No intravenous contrast was administered. COMPARISON:  None. FINDINGS: TENDONS Peroneal: Mild common peroneus tenosynovitis. Posteromedial: Distal tibialis posterior tenosynovitis and mild distal tibialis posterior tendinopathy. Mild flexor hallucis longus tenosynovitis just proximal to the knot of Henry. Anterior: Unremarkable Achilles: Distal Achilles tendinopathy with expansion of the distal Achilles tendon and multiple spurs and ossicles in the calcaneus along the distal Achilles attachment site. Plantar Fascia: There is discontinuity in both the medial and lateral bands of the plantar fascia proximally due to a large fluid collection which extends into the calcaneus and drains down to the plantar surface of the foot. Appearance is compatible with partial rupture. LIGAMENTS Lateral: Attenuated distal attachment of the anterior talofibular ligament, query remote injury. Thickened calcaneofibular ligament. Medial: Thickened tibiospring and tibionavicular components of the deltoid ligament. CARTILAGE Ankle Joint: 0.8 by 1.1 by 0.6 cm non-fragmented osteochondral lesion of the lateral talar dome, image 18/5. Mild degenerative chondral thinning. Subtalar Joints/Sinus Tarsi: Small effusion of the posterior subtalar facet. Spurring of the subtalar joint. Bones: Abnormal fluid collection in the calcaneus measuring 3.5 by 2.4 by 2.2 cm, likely chronic, with surrounding mild marrow edema in the  calcaneus. This fluid collection is continuous with a soft tissue fluid collection extending in region of the mostly ruptured plantar fascia. There is a draining sinus tract to the plantar heel on image 10/6. Degenerative and possibly erosive findings in the midfoot and Lisfranc joint but no other regions compelling for active osteomyelitis. Other: Diffuse striking subcutaneous edema in the ankle, possibly from cellulitis.  Diffuse muscular edema, especially along the plantar hindfoot and midfoot, which could be neurogenic edema or myositis. IMPRESSION: 1. 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 2. Diffuse striking subcutaneous edema in the ankle, possibly from cellulitis. 3. Diffuse muscular edema, especially along the plantar hindfoot and midfoot, which could be neurogenic edema or myositis. 4. Distal Achilles tendinopathy with expansion of the distal Achilles tendon and multiple spurs and ossicles along the distal Achilles attachment site. 5. Distal tibialis posterior tenosynovitis and mild distal tibialis posterior tendinopathy. 6. Mild flexor hallucis longus tenosynovitis just proximal to the knot of Henry. 7. Mild common peroneus tenosynovitis. 8. Attenuated distal attachment of the anterior talofibular ligament, query remote injury. 9. Thickened tibiospring and tibionavicular components of the deltoid ligament, query remote injury. 10. 0.8 by 1.1 cm non-fragmented osteochondral lesion of the lateral talar dome. 11. Degenerative and possibly erosive findings in the midfoot and Lisfranc joint but no other regions compelling for active osteomyelitis. Electronically Signed   By: Van Clines M.D.   On: 12/08/2019 18:30   [place] EKG SITE RITE  Result Date: 12/12/2019 If Site Rite image not attached, placement could not be confirmed due to current cardiac rhythm.  [place] EKG SITE RITE  Result Date: 12/12/2019 If Site Rite image not attached, placement could not be confirmed due to current cardiac rhythm.    CBC Recent Labs  Lab 12/08/19 1754 12/09/19 0614 12/11/19 0553  WBC 17.9* 13.0* 12.3*  HGB 10.0* 8.4* 9.1*  HCT 33.5* 28.1* 29.7*  PLT 467* 402* 415*  MCV 94.6 92.4 92.2  MCH 28.2 27.6 28.3  MCHC 29.9* 29.9* 30.6    RDW 15.9* 15.6* 15.9*  LYMPHSABS 0.4*  --   --   MONOABS 0.9  --   --   EOSABS 0.0  --   --   BASOSABS 0.0  --   --     Chemistries  Recent Labs  Lab 12/08/19 1754 12/09/19 0614 12/10/19 0445 12/11/19 0553  NA 138 140 139 138  K 3.8 3.4* 4.2 4.0  CL 103 106 108 105  CO2 20* 22 21* 22  GLUCOSE 196* 105* 116* 135*  BUN 56* 55* 56* 59*  CREATININE 3.55* 3.50* 3.55* 3.73*  CALCIUM 8.3* 7.9* 7.7* 7.9*  AST 19 20  --   --   ALT 14 12  --   --   ALKPHOS 102 77  --   --   BILITOT 0.6 0.8  --   --    ------------------------------------------------------------------------------------------------------------------ No results for input(s): CHOL, HDL, LDLCALC, TRIG, CHOLHDL, LDLDIRECT in the last 72 hours.  Lab Results  Component Value Date   HGBA1C 7.2 (H) 12/08/2019   ------------------------------------------------------------------------------------------------------------------ No results for input(s): TSH, T4TOTAL, T3FREE, THYROIDAB in the last 72 hours.  Invalid input(s): FREET3 ------------------------------------------------------------------------------------------------------------------ No results for input(s): VITAMINB12, FOLATE, FERRITIN, TIBC, IRON, RETICCTPCT in the last 72 hours.  Coagulation profile No results for input(s): INR, PROTIME in the last  168 hours.  No results for input(s): DDIMER in the last 72 hours.  Cardiac Enzymes No results for input(s): CKMB, TROPONINI, MYOGLOBIN in the last 168 hours.  Invalid input(s): CK ------------------------------------------------------------------------------------------------------------------ No results found for: BNP   Roxan Hockey M.D on 12/12/2019 at 4:26 PM  Go to [email or web address] - for contact info  [facility] - Office  [phone]

## 2019-12-12 NOTE — Progress Notes (Signed)
PODIATRY PROGRESS NOTE  SUBJECTIVE This 69 year old male is postoperative day 1 status post incision and drainage of abscess of left foot with application of wound VAC.  His pain has improved.  He denies any nausea, vomiting, chills, shortness of breath or chest pain.  MEDICATIONS Scheduled Meds: . amLODipine  5 mg Oral q morning - 10a  . atorvastatin  20 mg Oral Daily  . DULoxetine  60 mg Oral Daily  . insulin aspart  0-5 Units Subcutaneous QHS  . insulin aspart  0-6 Units Subcutaneous TID WC  . pantoprazole  40 mg Oral BID  . torsemide  40 mg Oral BID   Continuous Infusions: . sodium chloride Stopped (12/11/19 0634)  . ceFEPime (MAXIPIME) IV 2 g (12/11/19 1842)  . metronidazole 500 mg (12/12/19 1259)  . vancomycin Stopped (12/11/19 0242)   PRN Meds:.acetaminophen **OR** acetaminophen, HYDROcodone-acetaminophen, morphine injection, ondansetron **OR** ondansetron (ZOFRAN) IV  OBJECTIVE Vital signs in last 24 hours:   Temp:  [97.7 F (36.5 C)-100.3 F (37.9 C)] 98.7 F (37.1 C) (06/08 0414) Pulse Rate:  [88-106] 97 (06/08 0414) Resp:  [18-20] 20 (06/08 0414) BP: (114-129)/(61-65) 114/65 (06/08 0414) SpO2:  [87 %-95 %] 87 % (06/08 0414)  DRESSING: Clean, dry and intact.  KCI wound VAC in place and operational at 125 mm of continuous pressure.  The dressing was not removed. VASCULAR: Capillary refill is brisk to all digits of the left foot.   MUSCULOSKELETAL: Calves are soft and nontender.  LAB/TEST RESULTS  Recent Labs    12/10/19 0445 12/11/19 0553  WBC  --  12.3*  HGB  --  9.1*  HCT  --  29.7*  PLT  --  415*  NA 139 138  K 4.2 4.0  CL 108 105  CO2 21* 22  BUN 56* 59*  CREATININE 3.55* 3.73*  GLUCOSE 116* 135*  CALCIUM 7.7* 7.9*    Recent Results (from the past 240 hour(s))  Culture, blood (Routine X 2) w Reflex to ID Panel     Status: None (Preliminary result)   Collection Time: 12/08/19  5:55 PM   Specimen: Left Antecubital; Blood  Result Value Ref Range  Status   Specimen Description LEFT ANTECUBITAL  Final   Special Requests   Final    BOTTLES DRAWN AEROBIC AND ANAEROBIC Blood Culture adequate volume   Culture   Final    NO GROWTH 4 DAYS Performed at [facility], [address], [place], [place] [address]    Report Status PENDING  Incomplete  Culture, blood (Routine X 2) w Reflex to ID Panel     Status: None (Preliminary result)   Collection Time: 12/08/19  5:55 PM   Specimen: Right Antecubital; Blood  Result Value Ref Range Status   Specimen Description RIGHT ANTECUBITAL  Final   Special Requests   Final    BOTTLES DRAWN AEROBIC AND ANAEROBIC Blood Culture adequate volume   Culture   Final    NO GROWTH 4 DAYS Performed at [facility], [address] [place], [place] [address]    Report Status PENDING  Incomplete  SARS Coronavirus 2 by RT PCR (hospital order, performed in [place] hospital lab) Nasopharyngeal Nasopharyngeal Swab     Status: None   Collection Time: 12/08/19  7:09 PM   Specimen: Nasopharyngeal Swab  Result Value Ref Range Status   SARS Coronavirus 2 NEGATIVE NEGATIVE Final    Comment: (NOTE) SARS-CoV-2 target nucleic acids are NOT DETECTED. The SARS-CoV-2 RNA is generally detectable in  upper and lower respiratory specimens during the acute phase of infection. The lowest concentration of SARS-CoV-2 viral copies this assay can detect is 250 copies / mL. A negative result does not preclude SARS-CoV-2 infection and should not be used as the sole basis for treatment or other patient management decisions.  A negative result may occur with improper specimen collection / handling, submission of specimen other than nasopharyngeal swab, presence of viral mutation(s) within the areas targeted by this assay, and inadequate number of viral copies (<250 copies / mL). A negative result must be combined with clinical observations, patient history, and epidemiological information. Fact Sheet for Patients:    StrictlyIdeas.no Fact Sheet for Healthcare Providers: BankingDealers.co.za This test is not yet approved or cleared  by the [place] FDA and has been authorized for detection and/or diagnosis of SARS-CoV-2 by FDA under an Emergency Use Authorization (EUA).  This EUA will remain in effect (meaning this test can be used) for the duration of the COVID-19 declaration under Section 564(b)(1) of the Act, 21 U.S.C. section 360bbb-3(b)(1), unless the authorization is terminated or revoked sooner. Performed at [facility], [address], [place], [place] [address]   Surgical pcr screen     Status: None   Collection Time: 12/10/19  8:44 PM   Specimen: Nasal Mucosa; Nasal Swab  Result Value Ref Range Status   MRSA, PCR NEGATIVE NEGATIVE Final   Staphylococcus aureus NEGATIVE NEGATIVE Final    Comment: (NOTE) The Xpert SA Assay (FDA approved for NASAL specimens in patients 23 years of age and older), is one component of a comprehensive surveillance program. It is not intended to diagnose infection nor to guide or monitor treatment. Performed at [facility], [address] [place], [place] [address]   Aerobic/Anaerobic Culture (surgical/deep wound)     Status: None (Preliminary result)   Collection Time: 12/11/19  8:34 AM   Specimen: Abscess  Result Value Ref Range Status   Specimen Description   Final    ABSCESS FOOT LEFT Performed at [facility], [address] [place], [place] [address]    Special Requests   Final    NONE Performed at [facility], [address] [place], [place] [address]    Gram Stain   Final    FEW WBC PRESENT,BOTH PMN AND MONONUCLEAR NO ORGANISMS SEEN    Culture   Final    CULTURE REINCUBATED FOR BETTER GROWTH Performed at [facility], [place] [address], [place], [place] [address]    Report Status PENDING  Incomplete  Aerobic/Anaerobic Culture (surgical/deep wound)     Status: None (Preliminary  result)   Collection Time: 12/11/19  8:40 AM   Specimen: Other Source; Tissue  Result Value Ref Range Status   Specimen Description   Final    TISSUE LEFT CALCANEUS Performed at [facility], [address] [place], [place] [address]    Special Requests   Final    NONE Performed at [facility], [address], [place], [place] [address]    Gram Stain   Final    FEW WBC PRESENT,BOTH PMN AND MONONUCLEAR NO ORGANISMS SEEN    Culture   Final    CULTURE REINCUBATED FOR BETTER GROWTH Performed at [facility], [place] [address], [place],  [address]    Report Status PENDING  Incomplete     No results found.  ASSESSMENT 1.  Postoperative day 1 status post I&D for abscess of left foot. 2.  Ulceration of left foot.  3.  Diabetes mellitus with peripheral neuropathy.  PLAN I will change his wound VAC tomorrow personally.  He will need a KCI home VAC unit.  I have completed the necessary forms for the home [facility] unit and placed on the patient's chart.  He will need home health for VAC changes.  I will change the VAC dressing each Monday in my [place] office.  Home health will need to change it on Wednesday and Fridays.  He will need PICC line placement for IV antibiotics.  I anticipate a 6 to 8-week course of antibiotics.  Antibiotic selection will be based on the surgical culture results.    Brita Romp 12/12/2019, 1:03 PM

## 2019-12-12 NOTE — Progress Notes (Signed)
Peripherally Inserted Central Catheter Placement  The IV Nurse has discussed with the patient and/or persons authorized to consent for the patient, the purpose of this procedure and the potential benefits and risks involved with this procedure.  The benefits include less needle sticks, lab draws from the catheter, and the patient may be discharged home with the catheter. Risks include, but not limited to, infection, bleeding, blood clot (thrombus formation), and puncture of an artery; nerve damage and irregular heartbeat and possibility to perform a PICC exchange if needed/ordered by physician.  Alternatives to this procedure were also discussed.  Bard Power PICC patient education guide, fact sheet on infection prevention and patient information card has been provided to patient /or left at bedside.    PICC Placement Documentation  PICC Single Lumen 12/12/19 PICC Right Brachial 39 cm 0 cm (Active)  Indication for Insertion or Continuance of Line Home intravenous therapies (PICC only) 12/12/19 1900  Exposed Catheter (cm) 0 cm 12/12/19 1900  Site Assessment Clean;Dry;Intact 12/12/19 1900  Line Status Flushed;Saline locked;Blood return noted 12/12/19 1900  Dressing Type Transparent;Securing device 12/12/19 1900  Dressing Status Clean;Dry;Intact;Antimicrobial disc in place 12/12/19 1900  Dressing Change Due 12/19/19 12/12/19 1900       Darlyn Read 12/12/2019, 7:19 PM

## 2019-12-12 NOTE — TOC Initial Note (Signed)
Transition of Care [facility]) - Initial/Assessment Note    Patient Details  Name: Connor [place] MRN: [id number] Date of Birth: 1950-12-21  Transition of Care [facility]) CM/SW Contact:    Boneta Lucks, RN Phone Number: 12/12/2019, 2:43 PM  Clinical Narrative:         Patient admitted with osteomyelitis. TOC working on discharge plan. Patient has orders for PICC line and will need 6 weeks of IV antibiotic. Choices given to patient. Carolynn Sayers accepted the referral for home infusion. [facility] in [place] will follow for RN. Faxed orders and clinicals to [phone].  Faxed orders to [facility] for wound vac to [place], [phone].   Expected Discharge Plan: [place] Barriers to Discharge: Continued Medical Work up   Patient Goals and CMS Choice Patient states their goals for this hospitalization and ongoing recovery are:: to go home. CMS Medicare.gov Compare Post Acute Care list provided to:: Patient Choice offered to / list presented to : Patient  Expected Discharge Plan and Services Expected Discharge Plan: [place] Acute Care Choice: Home Health, Durable Medical Equipment Living arrangements for the past 2 months: Single Family Home Expected Discharge Date: 12/12/19               DME Arranged: Jim Desanct[facility] vac) DME Agency: [employer] Date DME Agency Contacted: 12/11/19 Time DME Agency Contacted: [phone] Representative spoke with at DME Agency: Blake Divine   [place]: [place] Date [place]: 12/12/19 Time [place]: [id number]    Prior Living Arrangements/Services Living arrangements for the past 2 months: [place] with:: Spouse   Do you feel safe going back to the place where you live?: Yes      Need for Family Participation in Patient Care: Yes (Comment) Care giver support system in place?: Yes (comment)   Criminal Activity/Legal Involvement Pertinent to  Current Situation/Hospitalization: No - Comment as needed  Activities of Daily Living Home Assistive Devices/Equipment: CBG Meter ADL Screening (condition at time of admission) Patient's cognitive ability adequate to safely complete daily activities?: Yes Is the patient deaf or have difficulty hearing?: No Does the patient have difficulty seeing, even when wearing glasses/contacts?: No Does the patient have difficulty concentrating, remembering, or making decisions?: No Patient able to express need for assistance with ADLs?: Yes Does the patient have difficulty dressing or bathing?: No Independently performs ADLs?: Yes (appropriate for developmental age) Does the patient have difficulty walking or climbing stairs?: Yes Weakness of Legs: Left Weakness of Arms/Hands: None  Permission Sought/Granted     Emotional Assessment     Affect (typically observed): Accepting, Pleasant Orientation: : Oriented to Self, Oriented to Place, Oriented to  Time, Oriented to Situation Alcohol / Substance Use: Not Applicable Psych Involvement: No (comment)  Admission diagnosis:  Osteomyelitis of foot, left, acute ([place]) [M86.172] Osteomyelitis of left foot, unspecified type [facility]) [M86.9] Patient Active Problem List   Diagnosis Date Noted  . Diabetes mellitus type 2, uncontrolled ([place]) 12/10/2019  . H/O gastric bypass 12/10/2019  . Hyperglycemia due to diabetes mellitus ([place]) 12/09/2019  . Acute kidney injury superimposed on chronic kidney disease ([place]) 12/09/2019  . Essential hypertension 12/09/2019  . Osteomyelitis of foot, left, acute ([place]) 12/08/2019   PCP:  Kennieth Rad, MD Pharmacy:  No Pharmacies Listed

## 2019-12-13 LAB — CULTURE, BLOOD (ROUTINE X 2)
Culture: NO GROWTH
Culture: NO GROWTH
Special Requests: ADEQUATE
Special Requests: ADEQUATE

## 2019-12-13 LAB — CBC
HCT: 28.7 % — ABNORMAL LOW (ref 39.0–52.0)
Hemoglobin: 8.5 g/dL — ABNORMAL LOW (ref 13.0–17.0)
MCH: 27.6 pg (ref 26.0–34.0)
MCHC: 29.6 g/dL — ABNORMAL LOW (ref 30.0–36.0)
MCV: 93.2 fL (ref 80.0–100.0)
Platelets: 461 10*3/uL — ABNORMAL HIGH (ref 150–400)
RBC: 3.08 MIL/uL — ABNORMAL LOW (ref 4.22–5.81)
RDW: 16 % — ABNORMAL HIGH (ref 11.5–15.5)
WBC: 11.1 10*3/uL — ABNORMAL HIGH (ref 4.0–10.5)
nRBC: 0 % (ref 0.0–0.2)

## 2019-12-13 LAB — GLUCOSE, CAPILLARY
Glucose-Capillary: 119 mg/dL — ABNORMAL HIGH (ref 70–99)
Glucose-Capillary: 142 mg/dL — ABNORMAL HIGH (ref 70–99)
Glucose-Capillary: 145 mg/dL — ABNORMAL HIGH (ref 70–99)

## 2019-12-13 LAB — BASIC METABOLIC PANEL
Anion gap: 15 (ref 5–15)
BUN: 71 mg/dL — ABNORMAL HIGH (ref 8–23)
CO2: 23 mmol/L (ref 22–32)
Calcium: 7.7 mg/dL — ABNORMAL LOW (ref 8.9–10.3)
Chloride: 100 mmol/L (ref 98–111)
Creatinine, Ser: 3.85 mg/dL — ABNORMAL HIGH (ref 0.61–1.24)
GFR calc Af Amer: 17 mL/min — ABNORMAL LOW (ref >60)
GFR calc non Af Amer: 15 mL/min — ABNORMAL LOW (ref >60)
Glucose, Bld: 113 mg/dL — ABNORMAL HIGH (ref 70–99)
Potassium: 4.1 mmol/L (ref 3.5–5.1)
Sodium: 138 mmol/L (ref 135–145)

## 2019-12-13 MED ORDER — SENNOSIDES-DOCUSATE SODIUM 8.6-50 MG PO TABS: 2.0000 | ORAL_TABLET | Freq: Every day | ORAL | 3 refills | Status: AC

## 2019-12-13 MED ORDER — GLIMEPIRIDE 2 MG PO TABS: 2.0000 mg | ORAL_TABLET | Freq: Every day | ORAL | 11 refills | Status: AC

## 2019-12-13 MED ORDER — METRONIDAZOLE 500 MG PO TABS: 500.0000 mg | ORAL_TABLET | Freq: Three times a day (TID) | ORAL | 0 refills | Status: DC

## 2019-12-13 MED ORDER — SODIUM CHLORIDE 0.9 % IV SOLN
2.0000 g | INTRAVENOUS | Status: DC
  Administered 2019-12-13: 2 g via INTRAVENOUS
  Filled 2019-12-13: qty 20

## 2019-12-13 MED ORDER — SENNOSIDES-DOCUSATE SODIUM 8.6-50 MG PO TABS: 2.0000 | ORAL_TABLET | Freq: Every day | ORAL | 3 refills | Status: DC

## 2019-12-13 MED ORDER — CEFTRIAXONE IV (FOR PTA / DISCHARGE USE ONLY): 2.0000 g | INTRAVENOUS | 0 refills | Status: AC

## 2019-12-13 MED ORDER — AMLODIPINE BESYLATE 10 MG PO TABS: 5.0000 mg | ORAL_TABLET | Freq: Every day | ORAL | 5 refills | Status: DC

## 2019-12-13 MED ORDER — AMLODIPINE BESYLATE 5 MG PO TABS: 5.0000 mg | ORAL_TABLET | Freq: Every day | ORAL | 2 refills | Status: AC

## 2019-12-13 MED ORDER — METRONIDAZOLE 500 MG PO TABS: 500.0000 mg | ORAL_TABLET | Freq: Three times a day (TID) | ORAL | 0 refills | Status: AC

## 2019-12-13 NOTE — Progress Notes (Signed)
PODIATRY PROGRESS NOTE  SUBJECTIVE This 69 year old male is postoperative day 2 status post incision and drainage of abscess of his left heel.  He is doing well.  He denies any nausea, vomiting, chills, shortness of breath or chest pain.  MEDICATIONS Scheduled Meds: . amLODipine  5 mg Oral q morning - 10a  . atorvastatin  20 mg Oral Daily  . Chlorhexidine Gluconate Cloth  6 each Topical Daily  . DULoxetine  60 mg Oral Daily  . insulin aspart  0-5 Units Subcutaneous QHS  . insulin aspart  0-6 Units Subcutaneous TID WC  . pantoprazole  40 mg Oral BID  . senna-docusate  2 tablet Oral BID  . sodium chloride flush  10-40 mL Intracatheter Q12H  . torsemide  40 mg Oral BID   Continuous Infusions: . sodium chloride Stopped (12/11/19 0634)  . ceFEPime (MAXIPIME) IV 2 g (12/12/19 1821)  . metronidazole 500 mg (12/13/19 0612)  . vancomycin 1,500 mg (12/12/19 2357)   PRN Meds:.acetaminophen **OR** acetaminophen, HYDROcodone-acetaminophen, morphine injection, ondansetron **OR** ondansetron (ZOFRAN) IV, sodium chloride flush  OBJECTIVE Vital signs in last 24 hours:   Temp:  [98.5 F (36.9 C)-99.4 F (37.4 C)] 98.7 F (37.1 C) (06/09 0800) Pulse Rate:  [86-98] 86 (06/09 0800) Resp:  [19-20] 19 (06/09 0800) BP: (100-129)/(45-73) 129/70 (06/09 0800) SpO2:  [93 %-95 %] 93 % (06/09 0800)  DRESSING: Gauze dressing covering KCI wound VAC dressing.  The VAC is in place and operational at 125 mm of continuous pressure.  No strikethrough is noted. INTEGUMENT: The surgical wound measures 3.6 x 2.0 x 1.6 cm.  The wound bed is viable.  No necrotic tissue is present.  The erythema has improved. VASCULAR.  The left dorsalis pedis pulse is palpable.  The left posterior tibial pulse is palpable.  There is edema of the left foot.  The edema has improved since admission. MUSCULOSKELETAL:  Calves are soft and nontender.  LAB/TEST RESULTS  Recent Labs    12/11/19 0553 12/13/19 0614  WBC 12.3* 11.1*  HGB  9.1* 8.5*  HCT 29.7* 28.7*  PLT 415* 461*  NA 138 138  K 4.0 4.1  CL 105 100  CO2 22 23  BUN 59* 71*  CREATININE 3.73* 3.85*  GLUCOSE 135* 113*  CALCIUM 7.9* 7.7*    Recent Results (from the past 240 hour(s))  Culture, blood (Routine X 2) w Reflex to ID Panel     Status: None   Collection Time: 12/08/19  5:55 PM   Specimen: Left Antecubital; Blood  Result Value Ref Range Status   Specimen Description LEFT ANTECUBITAL  Final   Special Requests   Final    BOTTLES DRAWN AEROBIC AND ANAEROBIC Blood Culture adequate volume   Culture   Final    NO GROWTH 5 DAYS Performed at [facility], [address] [place], [place] [address]    Report Status 12/13/2019 FINAL  Final  Culture, blood (Routine X 2) w Reflex to ID Panel     Status: None   Collection Time: 12/08/19  5:55 PM   Specimen: Right Antecubital; Blood  Result Value Ref Range Status   Specimen Description RIGHT ANTECUBITAL  Final   Special Requests   Final    BOTTLES DRAWN AEROBIC AND ANAEROBIC Blood Culture adequate volume   Culture   Final    NO GROWTH 5 DAYS Performed at [facility], [address] [place], [place] [address]    Report Status 12/13/2019 FINAL  Final  SARS Coronavirus  2 by RT PCR (hospital order, performed in [facility] hospital lab) Nasopharyngeal Nasopharyngeal Swab     Status: None   Collection Time: 12/08/19  7:09 PM   Specimen: Nasopharyngeal Swab  Result Value Ref Range Status   SARS Coronavirus 2 NEGATIVE NEGATIVE Final    Comment: (NOTE) SARS-CoV-2 target nucleic acids are NOT DETECTED. The SARS-CoV-2 RNA is generally detectable in upper and lower respiratory specimens during the acute phase of infection. The lowest concentration of SARS-CoV-2 viral copies this assay can detect is 250 copies / mL. A negative result does not preclude SARS-CoV-2 infection and should not be used as the sole basis for treatment or other patient management decisions.  A negative result may occur with improper  specimen collection / handling, submission of specimen other than nasopharyngeal swab, presence of viral mutation(s) within the areas targeted by this assay, and inadequate number of viral copies (<250 copies / mL). A negative result must be combined with clinical observations, patient history, and epidemiological information. Fact Sheet for Patients:   StrictlyIdeas.no Fact Sheet for Healthcare Providers: BankingDealers.co.za This test is not yet approved or cleared  by the [place] FDA and has been authorized for detection and/or diagnosis of SARS-CoV-2 by FDA under an Emergency Use Authorization (EUA).  This EUA will remain in effect (meaning this test can be used) for the duration of the COVID-19 declaration under Section 564(b)(1) of the Act, 21 U.S.C. section 360bbb-3(b)(1), unless the authorization is terminated or revoked sooner. Performed at [facility], [address], [place], [place] [address]   Surgical pcr screen     Status: None   Collection Time: 12/10/19  8:44 PM   Specimen: Nasal Mucosa; Nasal Swab  Result Value Ref Range Status   MRSA, PCR NEGATIVE NEGATIVE Final   Staphylococcus aureus NEGATIVE NEGATIVE Final    Comment: (NOTE) The Xpert SA Assay (FDA approved for NASAL specimens in patients 26 years of age and older), is one component of a comprehensive surveillance program. It is not intended to diagnose infection nor to guide or monitor treatment. Performed at [facility], [address] [place], [place] [address]   Aerobic/Anaerobic Culture (surgical/deep wound)     Status: None (Preliminary result)   Collection Time: 12/11/19  8:34 AM   Specimen: Abscess  Result Value Ref Range Status   Specimen Description   Final    ABSCESS FOOT LEFT Performed at [facility], [address], [place], [place] [address]    Special Requests   Final    NONE Performed at [facility], [address], [place], [place]  [address]    Gram Stain   Final    FEW WBC PRESENT,BOTH PMN AND MONONUCLEAR NO ORGANISMS SEEN Performed at [facility], [place] [address] [place], [place] [address]    Culture   Final    CULTURE REINCUBATED FOR BETTER GROWTH NO ANAEROBES ISOLATED; CULTURE IN PROGRESS FOR 5 DAYS    Report Status PENDING  Incomplete  Aerobic/Anaerobic Culture (surgical/deep wound)     Status: None (Preliminary result)   Collection Time: 12/11/19  8:40 AM   Specimen: Other Source; Tissue  Result Value Ref Range Status   Specimen Description   Final    TISSUE LEFT CALCANEUS Performed at [facility], [address], [place], [place] [address]    Special Requests   Final    NONE Performed at [facility], [address], [place], [place] [address]    Gram Stain   Final  FEW WBC PRESENT,BOTH PMN AND MONONUCLEAR NO ORGANISMS SEEN    Culture   Final    CULTURE REINCUBATED FOR BETTER GROWTH Performed at [facility], [place] [address] [place], [place] [address]    Report Status PENDING  Incomplete     [place] EKG SITE RITE  Result Date: 12/12/2019 If Site Rite image not attached, placement could not be confirmed due to current cardiac rhythm.  [place] EKG SITE RITE  Result Date: 12/12/2019 If Site Rite image not attached, placement could not be confirmed due to current cardiac rhythm.   ASSESSMENT 1.  Status post incision and drainage of abscess, left foot. 2.  Osteomyelitis of calcaneus, left foot. 3.  Ulceration, left foot.   4.  Diabetes mellitus with peripheral neuropathy.  PLAN I changed the wound VAC personally.  The wound VAC is operational at 125 mm of continuous pressure.  A Kerlix was applied over the wound VAC dressing and secured with an ACE wrap.  He is cleared for discharge from my standpoint.  He is to remain nonweightbearing on his left foot.  He may put weight on his left foot to transfer only.  I discussed antibiotic selection with Dr. Denton Brick.  The plan is to discharge him on IV  Rocephin and metronidazole.  I will follow his cultures and change his antibiotics based on the sensitivity results.  He is to follow-up with me on Monday in my [place] office.  Brita Romp 12/13/2019, 10:09 AM

## 2019-12-13 NOTE — Care Management Important Message (Signed)
Important Message  Patient Details  Name: Connor [place] MRN: [id number] Date of Birth: [birth year]   Medicare Important Message Given:  Yes     Tommy Medal 12/13/2019, 1:00 PM

## 2019-12-13 NOTE — Progress Notes (Signed)
PHARMACY CONSULT NOTE FOR:  OUTPATIENT  PARENTERAL ANTIBIOTIC THERAPY (OPAT)  Indication: osteomyelitis of calcaneus, left foot Regimen: Ceftriaxone 2000 mg IV every 24 hours End date: Last day of therapy 01/22/20  IV antibiotic discharge orders are pended. To discharging provider:  please sign these orders via discharge navigator,  Select New Orders & click on the button choice - Manage This Unsigned Work.     Thank you for allowing pharmacy to be a part of this patient's care.  Ramond Craver 12/13/2019, 11:15 AM

## 2019-12-13 NOTE — Discharge Instructions (Signed)
1) IV Rocephin 2 g daily and p.o. Flagyl 500 mg 3 times a day for Osteomyelitis left foot First Dose: No (first dose given on 12/13/19 in the Hospital) Last Day of Therapy:  01/22/2020 Labs - Once weekly:  CBC/D and BMP, Labs - Every other week:  ESR and CRP Method of administration: IV Push  2)-- home health wound care instructions as follows ---- Plan will be to discharge home with home health RN  for [facility] changes.    Dr. Caprice Beaver and the podiatrist will change the [facility] dressing each Monday in his [place] office.  Home health RN will need to change it on Wednesday and Fridays.     3)Avoid ibuprofen/Advil/Aleve/Motrin/[place]y Powders/Naproxen/BC powders/Meloxicam/Diclofenac/Indomethacin and other Nonsteroidal anti-inflammatory medications as these will make you more likely to bleed and can cause stomach ulcers, can also cause Kidney problems.   4) please follow-up with your podiatrist Dr. Caprice Beaver for the final wound culture results-your antibiotics may have to be adjusted based on the results of your wound culture  5)No weight bearing on left foot-----May temporarily put weight on left heel to pivot and transfer  6)Your Blood Sugars are running higher---Please Take Amaryl 2 mg daily with Breakfast

## 2019-12-13 NOTE — Discharge Summary (Signed)
Connor Taylor, is a 69 y.o. male  DOB [birth year]  MRN [id number].  Admission date:  12/08/2019  Admitting Physician  Edmonia Lynch, DO  Discharge Date:  12/13/2019   Primary MD  Kennieth Rad, MD  Recommendations for primary care physician for things to follow:   1) IV Rocephin 2 g daily and p.o. Flagyl 500 mg 3 times a day for Osteomyelitis left foot First Dose: No (first dose given on 12/13/19 in the Hospital) Last Day of Therapy:  01/22/2020 Labs - Once weekly:  CBC/D and BMP, Labs - Every other week:  ESR and CRP Method of administration: IV Push  2)-- home health wound care instructions as follows ---- Plan will be to discharge home with home health RN  for [facility] changes.    Dr. Caprice Beaver and the podiatrist will change the [facility] dressing each Monday in his [place] office.  Home health RN will need to change it on Wednesday and Fridays.     3)Avoid ibuprofen/Advil/Aleve/Motrin/Goody Powders/Naproxen/BC powders/Meloxicam/Diclofenac/Indomethacin and other Nonsteroidal anti-inflammatory medications as these will make you more likely to bleed and can cause stomach ulcers, can also cause Kidney problems.   4) please follow-up with your podiatrist Dr. Caprice Beaver for the final wound culture results-your antibiotics may have to be adjusted based on the results of your wound culture  5)No weight bearing on left foot-----May temporarily put weight on left heel to pivot and transfer  6)Your Blood Sugars are running higher---Please Take Amaryl 2 mg daily with Breakfast   Admission Diagnosis  Osteomyelitis of foot, left, acute ([place]) [M86.172] Osteomyelitis of left foot, unspecified type [facility]) [M86.9]  Discharge Diagnosis  Osteomyelitis of foot, left, acute ([place]) [M86.172] Osteomyelitis of left foot, unspecified type ([place]) [M86.9]    Principal Problem:   Osteomyelitis of foot, left, acute ([place]) Active Problems:  Acute kidney injury superimposed on chronic kidney disease ([place])   Diabetes mellitus type 2, uncontrolled ([place])   Essential hypertension   H/O gastric bypass      Past Medical History:  Diagnosis Date  . Diabetes mellitus without complication ([place])   . Kidney damage     Past Surgical History:  Procedure Laterality Date  . FOOT SURGERY       HPI  from the history and physical done on the day of admission:    Chief Complaint: Patient sent to ED by podiatrist/Dr. Caprice Beaver  HPI: Connor Taylor is a 69 y.o. male with medical history significant of hypertension, hyperlipidemia, diabetes mellitus and chronic kidney disease who was sent here by Dr. Kalman Shan for admission because of left foot infection.  Patient states that he has wound on his left heel for the last few weeks and it continue to worsen and now he is having bad infection of left foot with continuous pain.  Patient states that he used outpatient antibiotics and pain medications without any relief.  Patient visited podiatrist/Dr. Caprice Beaver who advised him to go to ED for admission.  Patient otherwise denies fever, chills, chest pain, shortness of breath, nausea,  vomiting, abdominal pain and urinary symptoms.  ED Course: On arrival to the ED patient had blood pressure of 140/61, temperature 98.2, heart rate 98, respiratory rate 18 and oxygen saturation 100% on room air.  Blood work showed leukocytosis with WBC count of 17.9, hemoglobin 10.0, sodium 138, potassium 3.8, BUN 56, creatinine 3.5, blood glucose 196 and albumin 2.9.  MRI of left foot is positive for osteomyelitis of heel.  Patient is given IV vancomycin, IV cefepime and IV metronidazole in the ED.  Review of Systems: As per HPI otherwise 10 point review of systems negative.    Hospital Course:   Brief Summary:- 68 y.o.malewith medical history significant ofhypertension, hyperlipidemia, diabetes mellitus and chronic kidney diseasepatient ofDr.  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 Caprice Beaver  -Plan will be to discharge home with home health RN  for [facility] changes.   Dr. Caprice Beaver  the podiatrist will change the [facility] dressing each Monday in his [place] office. Home health RN will need to change it on Wednesday and Fridays. Pt has PICC line   for 6 to 8 weeks of IV antibiotics (iv Rocephin 2 g daily and p.o. Flagyl 500 every 8)  Antibiotic selection can be adjusted based on the surgical culture results.   -On 12/11/2019 patient underwent PROCEDURE 1. Incision and drainage of abscess, left foot 2. 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--prior labs not available creatinine currently in the 3.5 range (up to 3.73) renally adjust medications, avoid nephrotoxic agents / dehydration / hypotension  4)HTN-stable, c/n Amlodipine  5)Chronic Lower Extremity Edema---may benefit from Ace wraps, continue Torsemide  6)Depression and Neuropathic Pain---Cymbalta as ordered  Disposition--Home with PICC line for IV antibiotics upon DC home   Dispo: The patient is from: Home  Anticipated d/c is to: Home with home health  Code Status : full  Family Communication:   NA (patient is alert, awake and  coherent)  Consults  :  Podiatry  -On 12/11/2019 patient underwent PROCEDURE 1. Incision and drainage of abscess, left foot 2. Application of Wound VAC, left foot 3.-Aerobic and anaerobic wound culture as well as calcaneal bone culture  Discharge Condition: stable  Follow UP  [facility] [place] Follow up.   Why:  RN Contact information: [place] [id number] [phone]        [facility] Follow up.   Why:  IV Antibiotics Contact information: [place] [phone]       [facility] .        [facility] Follow up.   Why: Wound Vac Contact information: [phone]          Diet and Activity recommendation:  As advised  Discharge Instructions     Discharge Instructions    [facility] pharmacist to adjust  dose for Vancomycin, Aminoglycosides and other anti-infective therapies as requested by physician.   Complete by: As directed    [facility] to provide Cath Flo 48m   Complete by: As directed    Administer for PICC line occlusion and as ordered by physician for other access device issues.   Anaphylaxis Kit: Provided to treat any anaphylactic reaction to the medication being provided to the patient if First Dose or when requested by physician   Complete by: As directed    Epinephrine 171mml vial / amp: Administer 0.59m38m0.59ml30mubcutaneously once for moderate to severe anaphylaxis, nurse to call physician and pharmacy when reaction occurs and call 911 if needed for immediate care   Diphenhydramine 50mg82mIV vial: Administer 25-50mg 7mM PRN for first dose reaction, rash, itching, mild reaction, nurse to call physician and pharmacy when reaction occurs   Sodium Chloride 0.9% NS 500ml I66mdminister if needed for hypovolemic blood pressure drop or as ordered by physician after call to physician with anaphylactic reaction   Call MD for:  difficulty breathing, headache or  visual disturbances   Complete by: As directed    Call MD for:  persistant dizziness or light-headedness   Complete by: As directed    Call MD for:  persistant nausea and vomiting   Complete by: As directed    Call MD for:  severe uncontrolled pain   Complete by: As directed    Call MD for:  temperature >100.4   Complete by: As directed    Change dressing (specify)   Complete by: As directed    Dressing change: Plan [facility]    Dr. McKinneCaprice Beavere podiatrist [facility] [place].  Home health RN will need to change it on Wednesday and Fridays.   Change dressing on IV access line weekly and PRN   Complete by: As directed    Diet - low sodium heart healthy   Complete by: As directed    Diet Carb Modified   Complete by: As directed    Discharge instructions   Complete by: As directed    1) IV Rocephin 2 g daily and p.o. Flagyl 500 mg 3 times a day for Osteomyelitis left foot First Dose: No (first dose given on 12/13/19 in the Hospital) Last Day of Therapy:  01/22/2020 Labs - Once weekly:  CBC/D and BMP, Labs - Every other week:  ESR and CRP Method of administration: IV Push  2)-- home health wound care instructions as follows ---- Plan [facility]    Dr. McKinneCaprice Beavere podiatrist [facility] [place].  Home health RN will need to change it on Wednesday and Fridays.     3)Avoid ibuprofen/Advil/Aleve/Motrin/Goody Powders/Naproxen/BC powders/Meloxicam/Diclofenac/Indomethacin and other Nonsteroidal anti-inflammatory medications as these will make you more likely to bleed and can cause stomach ulcers, can also cause Kidney problems.   4) please follow-up with your podiatrist Dr. McKinneCaprice Beavere final wound culture results-your antibiotics may have to be adjusted based on the results of your wound  culture  5)No weight bearing on left foot-----May temporarily put weight on left heel to pivot and transfer  6)Your Blood Sugars are running higher---Please Take Amaryl 2 mg daily with Breakfast   Flush IV access with Sodium Chloride 0.9% and Heparin 10  units/ml or 100 units/ml   Complete by: As directed    Home infusion instructions - [facility]   Complete by: As directed    Instructions: Flush IV access with Sodium Chloride 0.9% and Heparin 10units/ml or 100units/ml   Change dressing on IV access line: Weekly and PRN   Instructions Cath Flo 81m: Administer for PICC Line occlusion and as ordered by physician for other access device   [facility] pharmacist to adjust dose for: Vancomycin, Aminoglycosides and other anti-infective therapies as requested by physician   Increase activity slowly   Complete by: As directed    No weight bearing on left foot-----May temporarily put weight on left heel to pivot and transfer   Method of administration may be changed at the discretion of home infusion pharmacist based upon assessment of the patient and/or caregiver's ability to self-administer the medication ordered   Complete by: As directed       Discharge Medications     Allergies as of 12/13/2019   No Known Allergies     Medication List    STOP taking these medications   clindamycin 300 MG capsule Commonly known as: CLEOCIN   doxycycline 100 MG tablet Commonly known as: ADOXA     TAKE these medications   amLODipine 10 MG tablet Commonly known as: NORVASC Take 0.5 tablets (5 mg total) by mouth daily. What changed:   medication strength  when to take this  Another medication with the same name was removed. Continue taking this medication, and follow the directions you see here.   atorvastatin 20 MG tablet Commonly known as: LIPITOR Take 20 mg by mouth daily.   Biotin 5 MG Caps Take 1 capsule by mouth daily.   Calcium Carbonate-Vitamin D 600-400  MG-UNIT tablet Take 1 tablet by mouth daily.   cefTRIAXone  IVPB Commonly known as: ROCEPHIN Inject 2 g into the vein daily. Indication:  Osteomyelitis left foot First Dose: No (first dose given on 12/13/19 in the Hospital) Last Day of Therapy:  01/22/2020 Labs - Once weekly:  CBC/D and BMP, Labs - Every other week:  ESR and CRP Method of administration: IV Push Method of administration may be changed at the discretion of home infusion pharmacist based upon assessment of the patient and/or caregiver's ability to self-administer the medication ordered. Start taking on: December 14, 2019   DULoxetine 60 MG capsule Commonly known as: CYMBALTA Take 60 mg by mouth daily.   ferrous sulfate 325 (65 FE) MG EC tablet Take 1 tablet by mouth 2 (two) times daily.   glimepiride 2 MG tablet Commonly known as: Amaryl Take 1 tablet (2 mg total) by mouth daily with breakfast.   metroNIDAZOLE 500 MG tablet Commonly known as: FLAGYL Take 1 tablet (500 mg total) by mouth 3 (three) times daily. Start taking on: January 24, 2020 What changed:   when to take this  These instructions start on January 24, 2020. If you are unsure what to do until then, ask your doctor or other care provider.   pantoprazole 40 MG tablet Commonly known as: PROTONIX Take 40 mg by mouth 2 (two) times daily.   senna-docusate 8.6-50 MG tablet Commonly known as: Senokot-S Take 2 tablets by mouth at bedtime.   torsemide 20 MG tablet Commonly known as: DEMADEX Take 40 mg by mouth 2 (two) times daily.   zinc sulfate 220 (50 Zn) MG capsule Take 1 capsule by mouth daily.  Discharge Care Instructions  (From admission, onward)         Start     Ordered   12/13/19 0000  Change dressing on IV access line weekly and PRN  (Home infusion instructions - [facility] )     12/13/19 1355   12/13/19 0000  Change dressing (specify)    Comments: Dressing change: Plan will be to discharge home with home health RN   for [facility] changes.    Dr. Caprice Beaver and the podiatrist will change the [facility] dressing each Monday in his [place] office.  Home health RN will need to change it on Wednesday and Fridays.   12/13/19 1355          Major procedures and Radiology Reports - PLEASE review detailed and final reports for all details, in brief -    MR FOOT LEFT WO CONTRAST  Result Date: 12/08/2019 CLINICAL DATA:  Diabetic foot swelling, possible osteomyelitis EXAM: MRI OF THE LEFT FOOT WITHOUT CONTRAST TECHNIQUE: Multiplanar, multisequence MR imaging of the ankle was performed. No intravenous contrast was administered. COMPARISON:  None. FINDINGS: TENDONS Peroneal: Mild common peroneus tenosynovitis. Posteromedial: Distal tibialis posterior tenosynovitis and mild distal tibialis posterior tendinopathy. Mild flexor hallucis longus tenosynovitis just proximal to the knot of Henry. Anterior: Unremarkable Achilles: Distal Achilles tendinopathy with expansion of the distal Achilles tendon and multiple spurs and ossicles in the calcaneus along the distal Achilles attachment site. Plantar Fascia: There is discontinuity in both the medial and lateral bands of the plantar fascia proximally due to a large fluid collection which extends into the calcaneus and drains down to the plantar surface of the foot. Appearance is compatible with partial rupture. LIGAMENTS Lateral: Attenuated distal attachment of the anterior talofibular ligament, query remote injury. Thickened calcaneofibular ligament. Medial: Thickened tibiospring and tibionavicular components of the deltoid ligament. CARTILAGE Ankle Joint: 0.8 by 1.1 by 0.6 cm non-fragmented osteochondral lesion of the lateral talar dome, image 18/5. Mild degenerative chondral thinning. Subtalar Joints/Sinus Tarsi: Small effusion of the posterior subtalar facet. Spurring of the subtalar joint. Bones: Abnormal fluid collection in the calcaneus measuring 3.5 by 2.4 by 2.2 cm, likely chronic, with  surrounding mild marrow edema in the calcaneus. This fluid collection is continuous with a soft tissue fluid collection extending in region of the mostly ruptured plantar fascia. There is a draining sinus tract to the plantar heel on image 10/6. Degenerative and possibly erosive findings in the midfoot and Lisfranc joint but no other regions compelling for active osteomyelitis. Other: Diffuse striking subcutaneous edema in the ankle, possibly from cellulitis. Diffuse muscular edema, especially along the plantar hindfoot and midfoot, which could be neurogenic edema or myositis. IMPRESSION: 1. 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 2. Diffuse striking subcutaneous edema in the ankle, possibly from cellulitis. 3. Diffuse muscular edema, especially along the plantar hindfoot and midfoot, which could be neurogenic edema or myositis. 4. Distal Achilles tendinopathy with expansion of the distal Achilles tendon and multiple spurs and ossicles along the distal Achilles attachment site. 5. Distal tibialis posterior tenosynovitis and mild distal tibialis posterior tendinopathy. 6. Mild flexor hallucis longus tenosynovitis just proximal to the knot of Henry. 7. Mild common peroneus tenosynovitis. 8. Attenuated distal attachment of the anterior talofibular ligament, query remote injury. 9. Thickened tibiospring and tibionavicular components of the deltoid ligament, query  remote injury. 10. 0.8 by 1.1 cm non-fragmented osteochondral lesion of the lateral talar dome. 11. Degenerative and possibly erosive findings in the midfoot and Lisfranc joint but no other regions compelling for active osteomyelitis. Electronically Signed   By: Van Clines M.D.   On: 12/08/2019 18:30   [place] EKG SITE RITE  Result Date: 12/12/2019 If Site  Rite image not attached, placement could not be confirmed due to current cardiac rhythm.  [place] EKG SITE RITE  Result Date: 12/12/2019 If Site Rite image not attached, placement could not be confirmed due to current cardiac rhythm.  Micro Results   Recent Results (from the past 240 hour(s))  Culture, blood (Routine X 2) w Reflex to ID Panel     Status: None   Collection Time: 12/08/19  5:55 PM   Specimen: Left Antecubital; Blood  Result Value Ref Range Status   Specimen Description LEFT ANTECUBITAL  Final   Special Requests   Final    BOTTLES DRAWN AEROBIC AND ANAEROBIC Blood Culture adequate volume   Culture   Final    NO GROWTH 5 DAYS Performed at [facility], [address] [place], [place] [address]    Report Status 12/13/2019 FINAL  Final  Culture, blood (Routine X 2) w Reflex to ID Panel     Status: None   Collection Time: 12/08/19  5:55 PM   Specimen: Right Antecubital; Blood  Result Value Ref Range Status   Specimen Description RIGHT ANTECUBITAL  Final   Special Requests   Final    BOTTLES DRAWN AEROBIC AND ANAEROBIC Blood Culture adequate volume   Culture   Final    NO GROWTH 5 DAYS Performed at [facility], [address] [place], [place] [address]    Report Status 12/13/2019 FINAL  Final  SARS Coronavirus 2 by RT PCR (hospital order, performed in [facility] hospital lab) Nasopharyngeal Nasopharyngeal Swab     Status: None   Collection Time: 12/08/19  7:09 PM   Specimen: Nasopharyngeal Swab  Result Value Ref Range Status   SARS Coronavirus 2 NEGATIVE NEGATIVE Final    Comment: (NOTE) SARS-CoV-2 target nucleic acids are NOT DETECTED. The SARS-CoV-2 RNA is generally detectable in upper and lower respiratory specimens during the acute phase of infection. The lowest concentration of SARS-CoV-2 viral copies this assay can detect is 250 copies / mL. A negative result does not preclude SARS-CoV-2 infection and should not be used as the sole basis for treatment or  other patient management decisions.  A negative result may occur with improper specimen collection / handling, submission of specimen other than nasopharyngeal swab, presence of viral mutation(s) within the areas targeted by this assay, and inadequate number of viral copies (<250 copies / mL). A negative result must be combined with clinical observations, patient history, and epidemiological information. Fact Sheet for Patients:   StrictlyIdeas.no Fact Sheet for Healthcare Providers: BankingDealers.co.za This test is not yet approved or cleared  by the [place] FDA and has been authorized for detection and/or diagnosis of SARS-CoV-2 by FDA under an Emergency Use Authorization (EUA).  This EUA will remain in effect (meaning this test can be used) for the duration of the COVID-19 declaration under Section 564(b)(1) of the Act, 21 U.S.C. section 360bbb-3(b)(1), unless the authorization is terminated or revoked sooner. Performed at [facility], [address] [place], [place] [address]   Surgical pcr screen     Status: None   Collection Time: 12/10/19  8:44 PM   Specimen: Nasal  Mucosa; Nasal Swab  Result Value Ref Range Status   MRSA, PCR NEGATIVE NEGATIVE Final   Staphylococcus aureus NEGATIVE NEGATIVE Final    Comment: (NOTE) The Xpert SA Assay (FDA approved for NASAL specimens in patients 44 years of age and older), is one component of a comprehensive surveillance program. It is not intended to diagnose infection nor to guide or monitor treatment. Performed at [facility], [address], [place], [place] [address]   Aerobic/Anaerobic Culture (surgical/deep wound)     Status: None (Preliminary result)   Collection Time: 12/11/19  8:34 AM   Specimen: Abscess  Result Value Ref Range Status   Specimen Description   Final    ABSCESS FOOT LEFT Performed at [facility], [address] [place], [place] [address]    Special Requests    Final    NONE Performed at [facility], [address], [place], [place] [address]    Gram Stain   Final    FEW WBC PRESENT,BOTH PMN AND MONONUCLEAR NO ORGANISMS SEEN Performed at [facility], [place] [address] [place], [place] [address]    Culture   Final    NORMAL SKIN FLORA NO ANAEROBES ISOLATED; CULTURE IN PROGRESS FOR 5 DAYS    Report Status PENDING  Incomplete  Aerobic/Anaerobic Culture (surgical/deep wound)     Status: None (Preliminary result)   Collection Time: 12/11/19  8:40 AM   Specimen: Other Source; Tissue  Result Value Ref Range Status   Specimen Description   Final    TISSUE LEFT CALCANEUS Performed at [facility], [address], [place], [place] [address]    Special Requests   Final    NONE Performed at [facility], [address], [place], [place] [address]    Gram Stain   Final    FEW WBC PRESENT,BOTH PMN AND MONONUCLEAR NO ORGANISMS SEEN    Culture   Final    RARE STAPHYLOCOCCUS WARNERI SUSCEPTIBILITIES TO FOLLOW Performed at [facility], [place] [address], [place], [place] [address]    Report Status PENDING  Incomplete    Today   Subjective    Sheri Prows today has no new complaints No fever  Or chills   No Nausea, Vomiting or Diarrhea        Patient has been seen and examined prior to discharge   Objective   Blood pressure 129/70, pulse 86, temperature 98.7 F (37.1 C), temperature source Oral, resp. rate 19, height 5' 6" (1.676 m), weight 112 kg, SpO2 93 %.   Intake/Output Summary (Last 24 hours) at 12/13/2019 1356 Last data filed at 12/13/2019 1100 Gross per 24 hour  Intake --  Output 5050 ml  Net -5050 ml   Exam Gen:- Awake Alert, no acute distress  HEENT:- .AT, No sclera icterus Neck-Supple Neck,No JVD,.  Lungs-  CTAB , good air movement bilaterally  CV- S1, S2 normal, regular Abd-  +ve B.Sounds, Abd Soft, No tenderness,    Extremity/Skin:- No  edema,   good pulses Psych-affect is appropriate, oriented  x3 Neuro-no new focal deficits, no tremors  MSK-left foot wound VAC in situ   Data Review   CBC w Diff:  Lab Results  Component Value Date   WBC 11.1 (H) 12/13/2019   HGB 8.5 (L) 12/13/2019   HCT 28.7 (L) 12/13/2019   PLT 461 (H) 12/13/2019   LYMPHOPCT 2 12/08/2019   MONOPCT 5 12/08/2019   EOSPCT 0 12/08/2019   BASOPCT 0 12/08/2019   CMP:  Lab Results  Component Value Date   NA 138 12/13/2019   K 4.1 12/13/2019   CL 100 12/13/2019   CO2 23 12/13/2019   BUN 71 (H) 12/13/2019   CREATININE 3.85 (H) 12/13/2019   PROT 6.2 (L) 12/09/2019   ALBUMIN 2.2 (L) 12/09/2019   BILITOT 0.8 12/09/2019   ALKPHOS 77 12/09/2019   AST 20 12/09/2019   ALT 12 12/09/2019   Total Discharge time is about 33 minutes  Roxan Hockey M.D on 12/13/2019 at 1:56 PM  Go to [email or web address] -  for contact info  [facility] - Office  [phone]

## 2019-12-13 NOTE — Progress Notes (Signed)
D/C instructions reviewed with patient, verbalized understanding. Hospital wound vac transferred to home wound vac. Patient verbalized understanding of wound vas and care of PICC line. To be transported to private vehicle via wheelchair, will continue to monitor.

## 2019-12-13 NOTE — TOC Transition Note (Signed)
Transition of Care [facility]) - CM/SW Discharge Note   Patient Details  Name: Connor Taylor MRN: [id number] Date of Birth: 1950-10-03  [facility] CM/SW Contact:  Salome Arnt, LCSW Phone Number: 12/13/2019, 2:32 PM   Clinical Narrative:   Pt d/c today. LCSW verified wound vac is set up with [facility]. [facility] notified of [facility]c for [facility]. [facility] also notified and have completed teaching with pt. LCSW sent clinicals to [facility]. LCSW confirmed above with pt.      Final next level of care: [place] Barriers to Discharge: Barriers Resolved   Patient Goals and CMS Choice Patient states their goals for this hospitalization and ongoing recovery are:: to go home. CMS Medicare.gov Compare Post Acute Care list provided to:: Patient Choice offered to / list presented to : Patient  Discharge Placement                    Patient and family notified of of transfer: 12/13/19  Discharge Plan and Services     Post Acute Care Choice: Home Health, Durable Medical Equipment          DME Arranged: Vac DME Agency: [facility] Date DME Agency Contacted: 12/13/19 Time DME Agency Contacted: [id number] Representative spoke with at DME Agency: [place]- wound vac HH Arranged: RN [place] Agency: [place] Date [place]: 12/13/19 Time [place]: 1432 Representative spoke with at [place]: [place] ([place]) Interventions     Readmission Risk Interventions No flowsheet data found.

## 2019-12-16 LAB — AEROBIC/ANAEROBIC CULTURE W GRAM STAIN (SURGICAL/DEEP WOUND): Culture: NORMAL

## 2019-12-19 ENCOUNTER — Encounter (HOSPITAL_COMMUNITY): Payer: Self-pay | Admitting: Podiatry

## 2020-12-02 IMAGING — MR MR FOOT*L* W/O CM
5 series · 40 of 40 positions shown · non-contrast
Comparison: None.

CLINICAL DATA: Diabetic foot swelling, possible osteomyelitis

EXAM:
MRI OF THE LEFT FOOT WITHOUT CONTRAST
TECHNIQUE: Multiplanar, multisequence MR imaging of the ankle was performed. No
intravenous contrast was administered.

[Series 3: pdfs axial · axial · 3.0mm · 0.62mm/px · z∈[-54,+70]mm · 10 of 32 slices shown]
[im 1/32]
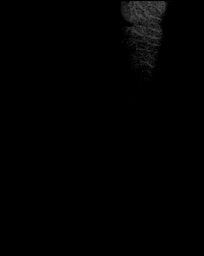
[im 4/32]
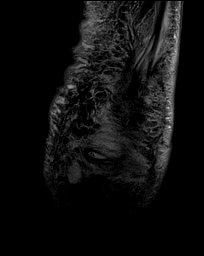
[im 7/32]
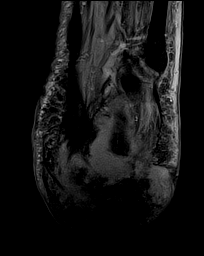
[im 11/32]
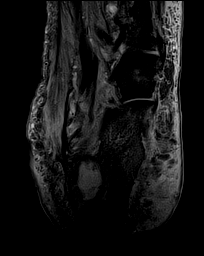
[im 14/32]
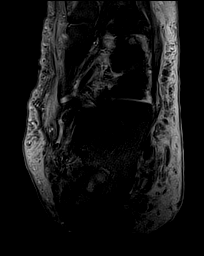
[im 18/32]
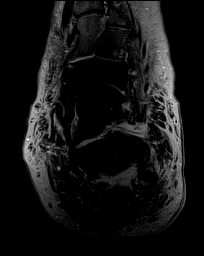
[im 21/32]
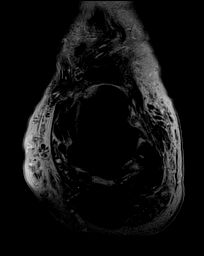
[im 25/32]
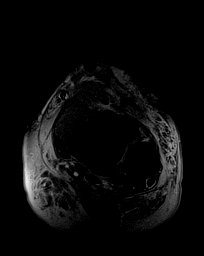
[im 28/32]
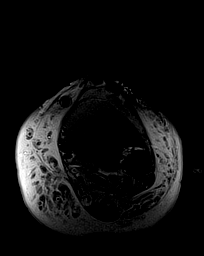
[im 32/32]
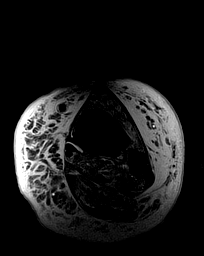

[Series 4: t2fs axial · axial · 3.0mm · 0.60mm/px · z∈[-54,+70]mm · 9 of 32 slices shown]
[im 1/32]
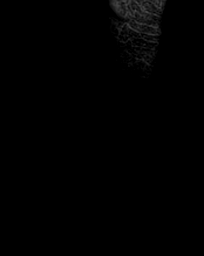
[im 4/32]
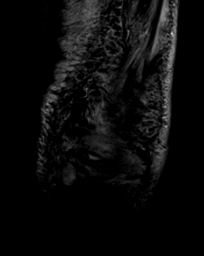
[im 8/32]
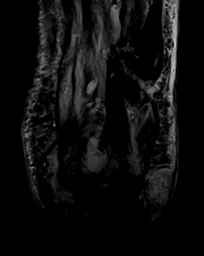
[im 12/32]
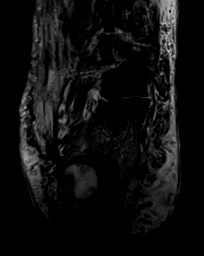
[im 16/32]
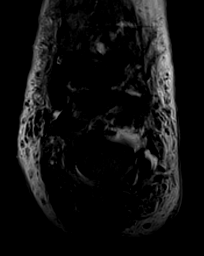
[im 20/32]
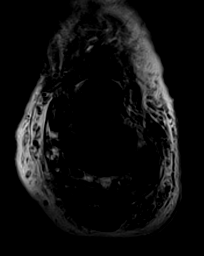
[im 24/32]
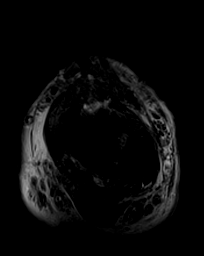
[im 28/32]
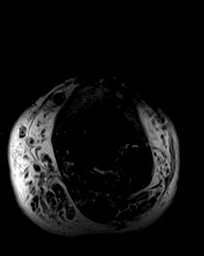
[im 32/32]
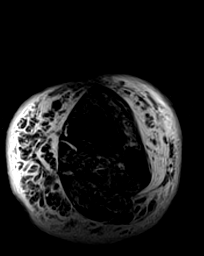

[Series 5: t2fs cor · coronal · 3.0mm · 0.57mm/px · 9 of 32 slices shown]
[im 1/32]
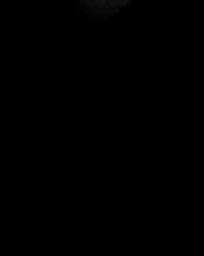
[im 4/32]
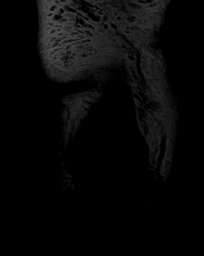
[im 8/32]
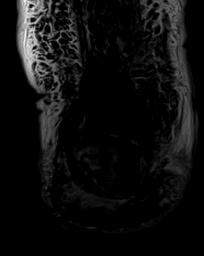
[im 12/32]
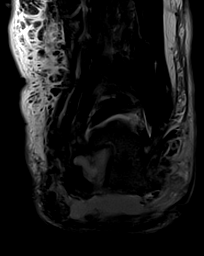
[im 16/32]
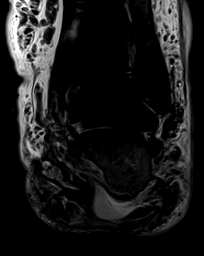
[im 20/32]
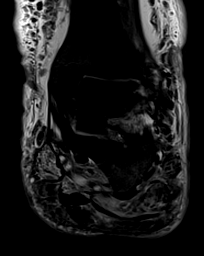
[im 24/32]
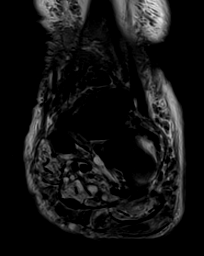
[im 28/32]
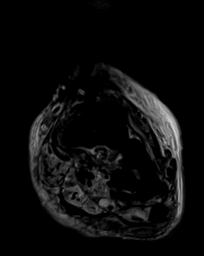
[im 32/32]
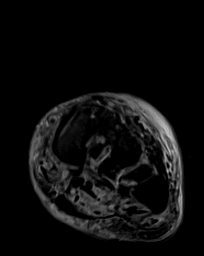

[Series 6: STIR · sagittal · 4.0mm · 0.70mm/px · 6 of 21 slices shown]
[im 1/21]
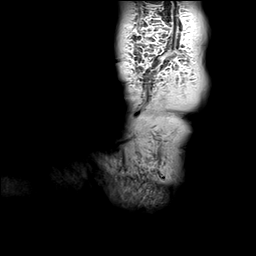
[im 5/21]
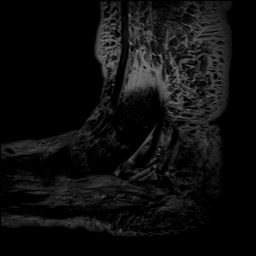
[im 9/21]
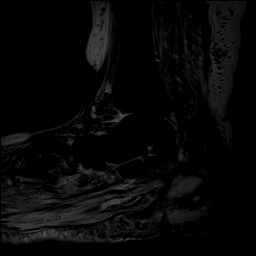
[im 13/21]
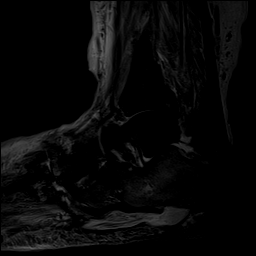
[im 17/21]
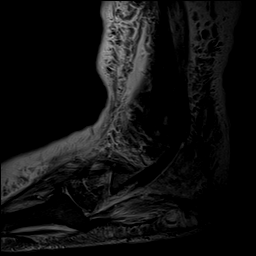
[im 21/21]
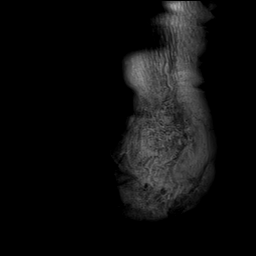

[Series 7: T1 · sagittal · 4.0mm · 0.70mm/px · 6 of 21 slices shown]
[im 1/21]
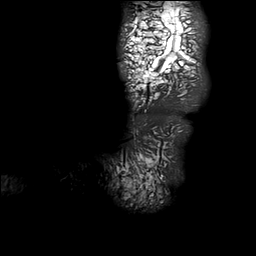
[im 5/21]
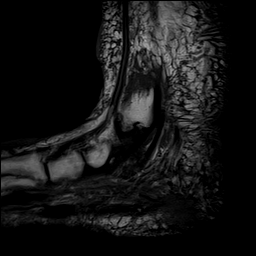
[im 9/21]
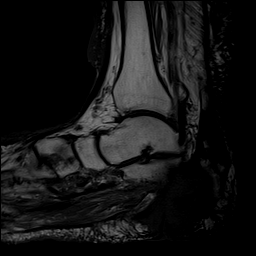
[im 13/21]
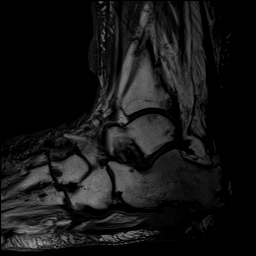
[im 17/21]
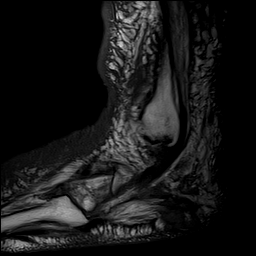
[im 21/21]
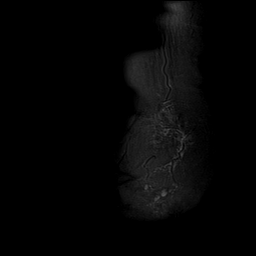

[40 of 40 positions shown; findings below may reference images not displayed]

FINDINGS: TENDONS

Peroneal: Mild common peroneus tenosynovitis.

Posteromedial: Distal tibialis posterior tenosynovitis and mild
distal tibialis posterior tendinopathy. Mild flexor hallucis longus
tenosynovitis just proximal to the knot of [place].

Anterior: Unremarkable

Achilles: Distal Achilles tendinopathy with expansion of the distal
Achilles tendon and multiple spurs and ossicles in the calcaneus
along the distal Achilles attachment site.

Plantar Fascia: There is discontinuity in both the medial and
lateral bands of the plantar fascia proximally due to a large fluid
collection which extends into the calcaneus and drains down to the
plantar surface of the foot. Appearance is compatible with partial
rupture.

LIGAMENTS

Lateral: Attenuated distal attachment of the anterior talofibular
ligament, query remote injury. Thickened calcaneofibular ligament.

Medial: Thickened tibiospring and tibionavicular components of the
deltoid ligament.

CARTILAGE

Ankle Joint: 0.8 by 1.1 by 0.6 cm non-fragmented osteochondral
lesion of the lateral talar dome, image [DATE]. Mild degenerative
chondral thinning.

Subtalar Joints/Sinus Tarsi: Small effusion of the posterior
subtalar facet. Spurring of the subtalar joint.

Bones: Abnormal fluid collection in the calcaneus measuring 3.5 by
2.4 by 2.2 cm, likely chronic, with surrounding mild marrow edema in
the calcaneus. This fluid collection is continuous with a soft
tissue fluid collection extending in region of the mostly ruptured
plantar fascia. There is a draining sinus tract to the plantar heel
on image [DATE].

Degenerative and possibly erosive findings in the midfoot and
Lisfranc joint but no other regions compelling for active
osteomyelitis.

Other: Diffuse striking subcutaneous edema in the ankle, possibly
from cellulitis. Diffuse muscular edema, especially along the
plantar hindfoot and midfoot, which could be neurogenic edema or
myositis.
IMPRESSION: 1. Chronic abscess/osteomyelitis in the calcaneus with surrounding
mild marrow edema, continuous with a plantar fluid collection in the
vicinity of the ruptured plantar fascia which has a draining sinus
tract to the plantar surface of the foot.
2. Diffuse striking subcutaneous edema in the ankle, possibly from
cellulitis.
3. Diffuse muscular edema, especially along the plantar hindfoot and
midfoot, which could be neurogenic edema or myositis.
4. Distal Achilles tendinopathy with expansion of the distal
Achilles tendon and multiple spurs and ossicles along the distal
Achilles attachment site.
5. Distal tibialis posterior tenosynovitis and mild distal tibialis
posterior tendinopathy.
6. Mild flexor hallucis longus tenosynovitis just proximal to the
knot of [place].
7. Mild common peroneus tenosynovitis.
8. Attenuated distal attachment of the anterior talofibular
ligament, query remote injury.
9. Thickened tibiospring and tibionavicular components of the
deltoid ligament, query remote injury.
[DATE] by 1.1 cm non-fragmented osteochondral lesion of the lateral
talar dome.
11. Degenerative and possibly erosive findings in the midfoot and
Lisfranc joint but no other regions compelling for active
osteomyelitis.
